# Patient Record
Sex: Female | Born: 1970 | Race: White | Hispanic: No | Smoking: Never smoker
Health system: Southern US, Community
[De-identification: ages and names within clinical notes are randomized; demographics above are authoritative.]

## PROBLEM LIST (undated history)

## (undated) DIAGNOSIS — J45909 Unspecified asthma, uncomplicated: Secondary | ICD-10-CM

## (undated) DIAGNOSIS — T7840XA Allergy, unspecified, initial encounter: Secondary | ICD-10-CM

## (undated) HISTORY — DX: Allergy, unspecified, initial encounter: T78.40XA

## (undated) HISTORY — DX: Unspecified asthma, uncomplicated: J45.909

---

## 1999-03-03 ENCOUNTER — Emergency Department (HOSPITAL_COMMUNITY): Admission: EM | Admit: 1999-03-03 | Discharge: 1999-03-03 | Payer: Self-pay | Admitting: Emergency Medicine

## 1999-08-29 ENCOUNTER — Emergency Department (HOSPITAL_COMMUNITY): Admission: EM | Admit: 1999-08-29 | Discharge: 1999-08-29 | Payer: Self-pay | Admitting: Emergency Medicine

## 2002-11-07 ENCOUNTER — Emergency Department (HOSPITAL_COMMUNITY): Admission: EM | Admit: 2002-11-07 | Discharge: 2002-11-07 | Payer: Self-pay | Admitting: Emergency Medicine

## 2002-11-11 ENCOUNTER — Emergency Department (HOSPITAL_COMMUNITY): Admission: EM | Admit: 2002-11-11 | Discharge: 2002-11-11 | Payer: Self-pay | Admitting: Emergency Medicine

## 2003-01-05 ENCOUNTER — Emergency Department (HOSPITAL_COMMUNITY): Admission: EM | Admit: 2003-01-05 | Discharge: 2003-01-05 | Payer: Self-pay | Admitting: Emergency Medicine

## 2003-01-13 ENCOUNTER — Emergency Department (HOSPITAL_COMMUNITY): Admission: EM | Admit: 2003-01-13 | Discharge: 2003-01-13 | Payer: Self-pay | Admitting: *Deleted

## 2003-02-01 ENCOUNTER — Emergency Department (HOSPITAL_COMMUNITY): Admission: EM | Admit: 2003-02-01 | Discharge: 2003-02-01 | Payer: Self-pay | Admitting: Emergency Medicine

## 2014-03-14 ENCOUNTER — Ambulatory Visit: Payer: Self-pay | Admitting: Physician Assistant

## 2014-03-17 ENCOUNTER — Telehealth: Payer: Self-pay | Admitting: Medical

## 2014-03-17 ENCOUNTER — Ambulatory Visit (INDEPENDENT_AMBULATORY_CARE_PROVIDER_SITE_OTHER): Payer: BC Managed Care – PPO | Admitting: Medical

## 2014-03-17 ENCOUNTER — Encounter: Payer: Self-pay | Admitting: Medical

## 2014-03-17 VITALS — BP 121/82 | HR 78 | Temp 98.4°F | Ht 64.75 in | Wt 258.6 lb

## 2014-03-17 DIAGNOSIS — Z0000 Encounter for general adult medical examination without abnormal findings: Secondary | ICD-10-CM

## 2014-03-17 DIAGNOSIS — Z0189 Encounter for other specified special examinations: Secondary | ICD-10-CM

## 2014-03-17 DIAGNOSIS — Z1231 Encounter for screening mammogram for malignant neoplasm of breast: Secondary | ICD-10-CM

## 2014-03-17 MED ORDER — ALBUTEROL SULFATE HFA 108 (90 BASE) MCG/ACT IN AERS: 2.0000 | INHALATION_SPRAY | Freq: Four times a day (QID) | RESPIRATORY_TRACT | Status: DC | PRN

## 2014-03-17 NOTE — Progress Notes (Signed)
Subjective:    Patient ID: Natalie Daniel, female    DOB: 11/23/1970, 43 y.o.   MRN: [id number]  HPI  PMH, PSH, FH, Social History, and surgery history.  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 2 years of college, drinks 1 coke a day, Pt does not exercise. Divorced- 3 children.  Pt states needs Physical today or she will charged $600 or more.  Pt hx of asthma and on an off wheezing. Her 02 sat 99%. Pt had asthma past 10 years.  History of arthritis- Daily knee pain recently.Hx of pain in the past. Just recently got worse.  Pt new and she wants CPE. I offered to see her for asthma and  arthralgia if she wants and do CPE later but she declined. On discussion with her she does not to have any labored breathing pattern.  Pt last physical done 2 years ago. Pap smear [id number]. Was normal. Pt has not had mammogram. Mom had breast cancer in early 50's.  [facility]   Past Medical History  Diagnosis Date  . Asthma   . Allergy     History   Social History  . Marital Status: Divorced    Spouse Name: N/A    Number of Children: N/A  . Years of Education: N/A   Occupational History  . Not on file.   Social History Main Topics  . Smoking status: Never Smoker   . Smokeless tobacco: Never Used  . Alcohol Use: No  . Drug Use: Not on file  . Sexual Activity: Not on file   Other Topics Concern  . Not on file   Social History Narrative  . No narrative on file    No past surgical history on file.  Family History  Problem Relation Age of Onset  . Cancer Mother     No Known Allergies  No current outpatient prescriptions on file prior to visit.   No current facility-administered medications on file prior to visit.    BP 121/82  Pulse 78  Temp(Src) 98.4 F (36.9 C) (Oral)  Ht 5' 4.75" (1.645 m)  Wt 258 lb 9.6 oz (117.3 kg)  BMI 43.35 kg/m2  SpO2 99%   Review of Systems  Constitutional: Negative for fever, chills and fatigue.  HENT:  Negative.   Respiratory: Positive for wheezing. Negative for cough, chest tightness and shortness of breath.        Mild occasional wheeze recently.  Gastrointestinal: Negative.   Genitourinary: Negative.   Musculoskeletal:       Some joint pain in the past. Mostly in her knees.  Neurological: Negative.   Hematological: Negative for adenopathy. Does not bruise/bleed easily.  Psychiatric/Behavioral: Negative.        Objective:   Physical Exam  Constitutional: She is oriented to person, place, and time. She appears well-developed and well-nourished. No distress.  HENT:  Head: Normocephalic and atraumatic.  Right Ear: External ear normal.  Left Ear: External ear normal.  Nose: Nose normal.  Mouth/Throat: Oropharynx is clear and moist. No oropharyngeal exudate.  Eyes: Conjunctivae and EOM are normal. Left eye exhibits no discharge. No scleral icterus.  Neck: Normal range of motion. Neck supple. No JVD present. No tracheal deviation present. No thyromegaly present.  Cardiovascular: Normal rate and regular rhythm.   Pulmonary/Chest: Effort normal and breath sounds normal. No stridor. No respiratory distress. She has no wheezes. She has no rales. She exhibits no tenderness.  Clear even and unlabored. No wheezing heard.  Abdominal: Soft. Bowel sounds are normal. She exhibits no distension and no mass. There is no tenderness. There is no rebound and no guarding.  Lymphadenopathy:    She has no cervical adenopathy.  Neurological: She is alert and oriented to person, place, and time. No cranial nerve deficit. Coordination normal.  Skin: Skin is warm. No rash noted. She is not diaphoretic. No erythema. No pallor.  Psychiatric: She has a normal mood and affect. Her behavior is normal. Judgment and thought content normal.             Assessment & Plan:

## 2014-03-17 NOTE — Telephone Encounter (Signed)
Pt wanted a cpe/wellness today. First time here. She reports some wheezing intermittent. I saw her  For cpe. Advised her to follow up 1-2 weeks. Discuss in depth arthritis and discuss asthma on follow up. I will go ahead and send albuterol rx to her pharmacy during the interim.

## 2014-03-17 NOTE — Progress Notes (Signed)
Pre visit review using our clinic review tool, if applicable. No additional management support is needed unless otherwise documented below in the visit note. 

## 2014-03-17 NOTE — Assessment & Plan Note (Signed)
Fasting cbc, cmp, tsh and lipid panel. Will order screening mammogram as well.

## 2014-03-17 NOTE — Patient Instructions (Addendum)
Please get labs today for your CPE. Follow up in 1-2 wks to discuss asthma and arthralgias   Preventive Care for Adults A healthy lifestyle and preventive care can promote health and wellness. Preventive health guidelines for women include the following key practices.  A routine yearly physical is a good way to check with your health care provider about your health and preventive screening. It is a chance to share any concerns and updates on your health and to receive a thorough exam.  Visit your dentist for a routine exam and preventive care every 6 months. Brush your teeth twice a day and floss once a day. Good oral hygiene prevents tooth decay and gum disease.  The frequency of eye exams is based on your age, health, family medical history, use of contact lenses, and other factors. Follow your health care provider's recommendations for frequency of eye exams.  Eat a healthy diet. Foods like vegetables, fruits, whole grains, low-fat dairy products, and lean protein foods contain the nutrients you need without too many calories. Decrease your intake of foods high in solid fats, added sugars, and salt. Eat the right amount of calories for you.Get information about a proper diet from your health care provider, if necessary.  Regular physical exercise is one of the most important things you can do for your health. Most adults should get at least 150 minutes of moderate-intensity exercise (any activity that increases your heart rate and causes you to sweat) each week. In addition, most adults need muscle-strengthening exercises on 2 or more days a week.  Maintain a healthy weight. The body mass index (BMI) is a screening tool to identify possible weight problems. It provides an estimate of body fat based on height and weight. Your health care provider can find your BMI and can help you achieve or maintain a healthy weight.For adults 20 years and older:  A BMI below 18.5 is considered  underweight.  A BMI of 18.5 to 24.9 is normal.  A BMI of 25 to 29.9 is considered overweight.  A BMI of 30 and above is considered obese.  Maintain normal blood lipids and cholesterol levels by exercising and minimizing your intake of saturated fat. Eat a balanced diet with plenty of fruit and vegetables. Blood tests for lipids and cholesterol should begin at age 67 and be repeated every 5 years. If your lipid or cholesterol levels are high, you are over 50, or you are at high risk for heart disease, you may need your cholesterol levels checked more frequently.Ongoing high lipid and cholesterol levels should be treated with medicines if diet and exercise are not working.  If you smoke, find out from your health care provider how to quit. If you do not use tobacco, do not start.  Lung cancer screening is recommended for adults aged 67-80 years who are at high risk for developing lung cancer because of a history of smoking. A yearly low-dose CT scan of the lungs is recommended for people who have at least a 30-pack-year history of smoking and are a current smoker or have quit within the past 15 years. A pack year of smoking is smoking an average of 1 pack of cigarettes a day for 1 year (for example: 1 pack a day for 30 years or 2 packs a day for 15 years). Yearly screening should continue until the smoker has stopped smoking for at least 15 years. Yearly screening should be stopped for people who develop a health problem that would prevent  them from having lung cancer treatment.  If you are pregnant, do not drink alcohol. If you are breastfeeding, be very cautious about drinking alcohol. If you are not pregnant and choose to drink alcohol, do not have more than 1 drink per day. One drink is considered to be 12 ounces (355 mL) of beer, 5 ounces (148 mL) of wine, or 1.5 ounces (44 mL) of liquor.  Avoid use of street drugs. Do not share needles with anyone. Ask for help if you need support or  instructions about stopping the use of drugs.  High blood pressure causes heart disease and increases the risk of stroke. Your blood pressure should be checked at least every 1 to 2 years. Ongoing high blood pressure should be treated with medicines if weight loss and exercise do not work.  If you are 54-34 years old, ask your health care provider if you should take aspirin to prevent strokes.  Diabetes screening involves taking a blood sample to check your fasting blood sugar level. This should be done once every 3 years, after age 14, if you are within normal weight and without risk factors for diabetes. Testing should be considered at a younger age or be carried out more frequently if you are overweight and have at least 1 risk factor for diabetes.  Breast cancer screening is essential preventive care for women. You should practice "breast self-awareness." This means understanding the normal appearance and feel of your breasts and may include breast self-examination. Any changes detected, no matter how small, should be reported to a health care provider. Women in their 27s and 30s should have a clinical breast exam (CBE) by a health care provider as part of a regular health exam every 1 to 3 years. After age 76, women should have a CBE every year. Starting at age 23, women should consider having a mammogram (breast X-ray test) every year. Women who have a family history of breast cancer should talk to their health care provider about genetic screening. Women at a high risk of breast cancer should talk to their health care providers about having an MRI and a mammogram every year.  Breast cancer gene (BRCA)-related cancer risk assessment is recommended for women who have family members with BRCA-related cancers. BRCA-related cancers include breast, ovarian, tubal, and peritoneal cancers. Having family members with these cancers may be associated with an increased risk for harmful changes (mutations) in  the breast cancer genes BRCA1 and BRCA2. Results of the assessment will determine the need for genetic counseling and BRCA1 and BRCA2 testing.  Routine pelvic exams to screen for cancer are no longer recommended for nonpregnant women who are considered low risk for cancer of the pelvic organs (ovaries, uterus, and vagina) and who do not have symptoms. Ask your health care provider if a screening pelvic exam is right for you.  If you have had past treatment for cervical cancer or a condition that could lead to cancer, you need Pap tests and screening for cancer for at least 20 years after your treatment. If Pap tests have been discontinued, your risk factors (such as having a new sexual partner) need to be reassessed to determine if screening should be resumed. Some women have medical problems that increase the chance of getting cervical cancer. In these cases, your health care provider may recommend more frequent screening and Pap tests.  The HPV test is an additional test that may be used for cervical cancer screening. The HPV test looks for the  virus that can cause the cell changes on the cervix. The cells collected during the Pap test can be tested for HPV. The HPV test could be used to screen women aged 76 years and older, and should be used in women of any age who have unclear Pap test results. After the age of 36, women should have HPV testing at the same frequency as a Pap test.  Colorectal cancer can be detected and often prevented. Most routine colorectal cancer screening begins at the age of 56 years and continues through age 15 years. However, your health care provider may recommend screening at an earlier age if you have risk factors for colon cancer. On a yearly basis, your health care provider may provide home test kits to check for hidden blood in the stool. Use of a small camera at the end of a tube, to directly examine the colon (sigmoidoscopy or colonoscopy), can detect the earliest forms  of colorectal cancer. Talk to your health care provider about this at age 76, when routine screening begins. Direct exam of the colon should be repeated every 5-10 years through age 50 years, unless early forms of pre-cancerous polyps or small growths are found.  People who are at an increased risk for hepatitis B should be screened for this virus. You are considered at high risk for hepatitis B if:  You were born in a country where hepatitis B occurs often. Talk with your health care provider about which countries are considered high risk.  Your parents were born in a high-risk country and you have not received a shot to protect against hepatitis B (hepatitis B vaccine).  You have HIV or AIDS.  You use needles to inject street drugs.  You live with, or have sex with, someone who has hepatitis B.  You get hemodialysis treatment.  You take certain medicines for conditions like cancer, organ transplantation, and autoimmune conditions.  Hepatitis C blood testing is recommended for all people born from [birth year] through 1965 and any individual with known risks for hepatitis C.  Practice safe sex. Use condoms and avoid high-risk sexual practices to reduce the spread of sexually transmitted infections (STIs). STIs include gonorrhea, chlamydia, syphilis, trichomonas, herpes, HPV, and human immunodeficiency virus (HIV). Herpes, HIV, and HPV are viral illnesses that have no cure. They can result in disability, cancer, and death.  You should be screened for sexually transmitted illnesses (STIs) including gonorrhea and chlamydia if:  You are sexually active and are younger than 24 years.  You are older than 24 years and your health care provider tells you that you are at risk for this type of infection.  Your sexual activity has changed since you were last screened and you are at an increased risk for chlamydia or gonorrhea. Ask your health care provider if you are at risk.  If you are at risk of  being infected with HIV, it is recommended that you take a prescription medicine daily to prevent HIV infection. This is called preexposure prophylaxis (PrEP). You are considered at risk if:  You are a heterosexual woman, are sexually active, and are at increased risk for HIV infection.  You take drugs by injection.  You are sexually active with a partner who has HIV.  Talk with your health care provider about whether you are at high risk of being infected with HIV. If you choose to begin PrEP, you should first be tested for HIV. You should then be tested every 3 months for as  long as you are taking PrEP.  Osteoporosis is a disease in which the bones lose minerals and strength with aging. This can result in serious bone fractures or breaks. The risk of osteoporosis can be identified using a bone density scan. Women ages 63 years and over and women at risk for fractures or osteoporosis should discuss screening with their health care providers. Ask your health care provider whether you should take a calcium supplement or vitamin D to reduce the rate of osteoporosis.  Menopause can be associated with physical symptoms and risks. Hormone replacement therapy is available to decrease symptoms and risks. You should talk to your health care provider about whether hormone replacement therapy is right for you.  Use sunscreen. Apply sunscreen liberally and repeatedly throughout the day. You should seek shade when your shadow is shorter than you. Protect yourself by wearing long sleeves, pants, a wide-brimmed hat, and sunglasses year round, whenever you are outdoors.  Once a month, do a whole body skin exam, using a mirror to look at the skin on your back. Tell your health care provider of new moles, moles that have irregular borders, moles that are larger than a pencil eraser, or moles that have changed in shape or color.  Stay current with required vaccines (immunizations).  Influenza vaccine. All adults  should be immunized every year.  Tetanus, diphtheria, and acellular pertussis (Td, Tdap) vaccine. Pregnant women should receive 1 dose of Tdap vaccine during each pregnancy. The dose should be obtained regardless of the length of time since the last dose. Immunization is preferred during the 27th-36th week of gestation. An adult who has not previously received Tdap or who does not know her vaccine status should receive 1 dose of Tdap. This initial dose should be followed by tetanus and diphtheria toxoids (Td) booster doses every 10 years. Adults with an unknown or incomplete history of completing a 3-dose immunization series with Td-containing vaccines should begin or complete a primary immunization series including a Tdap dose. Adults should receive a Td booster every 10 years.  Varicella vaccine. An adult without evidence of immunity to varicella should receive 2 doses or a second dose if she has previously received 1 dose. Pregnant females who do not have evidence of immunity should receive the first dose after pregnancy. This first dose should be obtained before leaving the health care facility. The second dose should be obtained 4-8 weeks after the first dose.  Human papillomavirus (HPV) vaccine. Females aged 13-26 years who have not received the vaccine previously should obtain the 3-dose series. The vaccine is not recommended for use in pregnant females. However, pregnancy testing is not needed before receiving a dose. If a female is found to be pregnant after receiving a dose, no treatment is needed. In that case, the remaining doses should be delayed until after the pregnancy. Immunization is recommended for any person with an immunocompromised condition through the age of 30 years if she did not get any or all doses earlier. During the 3-dose series, the second dose should be obtained 4-8 weeks after the first dose. The third dose should be obtained 24 weeks after the first dose and 16 weeks after  the second dose.  Zoster vaccine. One dose is recommended for adults aged 51 years or older unless certain conditions are present.  Measles, mumps, and rubella (MMR) vaccine. Adults born before [birth year] generally are considered immune to measles and mumps. Adults born in [birth year] or later should have 1 or more doses  of MMR vaccine unless there is a contraindication to the vaccine or there is laboratory evidence of immunity to each of the three diseases. A routine second dose of MMR vaccine should be obtained at least 28 days after the first dose for students attending postsecondary schools, health care workers, or international travelers. People who received inactivated measles vaccine or an unknown type of measles vaccine during 1963-1967 should receive 2 doses of MMR vaccine. People who received inactivated mumps vaccine or an unknown type of mumps vaccine before 1979 and are at high risk for mumps infection should consider immunization with 2 doses of MMR vaccine. For females of childbearing age, rubella immunity should be determined. If there is no evidence of immunity, females who are not pregnant should be vaccinated. If there is no evidence of immunity, females who are pregnant should delay immunization until after pregnancy. Unvaccinated health care workers born before [birth year] who lack laboratory evidence of measles, mumps, or rubella immunity or laboratory confirmation of disease should consider measles and mumps immunization with 2 doses of MMR vaccine or rubella immunization with 1 dose of MMR vaccine.  Pneumococcal 13-valent conjugate (PCV13) vaccine. When indicated, a person who is uncertain of her immunization history and has no record of immunization should receive the PCV13 vaccine. An adult aged 28 years or older who has certain medical conditions and has not been previously immunized should receive 1 dose of PCV13 vaccine. This PCV13 should be followed with a dose of pneumococcal polysaccharide (PPSV23)  vaccine. The PPSV23 vaccine dose should be obtained at least 8 weeks after the dose of PCV13 vaccine. An adult aged 74 years or older who has certain medical conditions and previously received 1 or more doses of PPSV23 vaccine should receive 1 dose of PCV13. The PCV13 vaccine dose should be obtained 1 or more years after the last PPSV23 vaccine dose.  Pneumococcal polysaccharide (PPSV23) vaccine. When PCV13 is also indicated, PCV13 should be obtained first. All adults aged 17 years and older should be immunized. An adult younger than age 38 years who has certain medical conditions should be immunized. Any person who resides in a nursing home or long-term care facility should be immunized. An adult smoker should be immunized. People with an immunocompromised condition and certain other conditions should receive both PCV13 and PPSV23 vaccines. People with human immunodeficiency virus (HIV) infection should be immunized as soon as possible after diagnosis. Immunization during chemotherapy or radiation therapy should be avoided. Routine use of PPSV23 vaccine is not recommended for American Indians, [place] Natives, or people younger than 65 years unless there are medical conditions that require PPSV23 vaccine. When indicated, people who have unknown immunization and have no record of immunization should receive PPSV23 vaccine. One-time revaccination 5 years after the first dose of PPSV23 is recommended for people aged 19-64 years who have chronic kidney failure, nephrotic syndrome, asplenia, or immunocompromised conditions. People who received 1-2 doses of PPSV23 before age 72 years should receive another dose of PPSV23 vaccine at age 28 years or later if at least 5 years have passed since the previous dose. Doses of PPSV23 are not needed for people immunized with PPSV23 at or after age 72 years.  Meningococcal vaccine. Adults with asplenia or persistent complement component deficiencies should receive 2 doses of  quadrivalent meningococcal conjugate (MenACWY-D) vaccine. The doses should be obtained at least 2 months apart. Microbiologists working with certain meningococcal bacteria, [place] recruits, people at risk during an outbreak, and people who travel to or  live in countries with a high rate of meningitis should be immunized. A first-year college student up through age 30 years who is living in a residence hall should receive a dose if she did not receive a dose on or after her 16th birthday. Adults who have certain high-risk conditions should receive one or more doses of vaccine.  Hepatitis A vaccine. Adults who wish to be protected from this disease, have certain high-risk conditions, work with hepatitis A-infected animals, work in hepatitis A research labs, or travel to or work in countries with a high rate of hepatitis A should be immunized. Adults who were previously unvaccinated and who anticipate close contact with an international adoptee during the first 60 days after arrival in the [place] from a country with a high rate of hepatitis A should be immunized.  Hepatitis B vaccine. Adults who wish to be protected from this disease, have certain high-risk conditions, may be exposed to blood or other infectious body fluids, are household contacts or sex partners of hepatitis B positive people, are clients or workers in certain care facilities, or travel to or work in countries with a high rate of hepatitis B should be immunized.  Haemophilus influenzae type b (Hib) vaccine. A previously unvaccinated person with asplenia or sickle cell disease or having a scheduled splenectomy should receive 1 dose of Hib vaccine. Regardless of previous immunization, a recipient of a hematopoietic stem cell transplant should receive a 3-dose series 6-12 months after her successful transplant. Hib vaccine is not recommended for adults with HIV infection. Preventive Services / Frequency Ages 73 to 60 years  Blood  pressure check.** / Every 1 to 2 years.  Lipid and cholesterol check.** / Every 5 years beginning at age 45.  Clinical breast exam.** / Every 3 years for women in their 55s and 37s.  BRCA-related cancer risk assessment.** / For women who have family members with a BRCA-related cancer (breast, ovarian, tubal, or peritoneal cancers).  Pap test.** / Every 2 years from ages 42 through 63. Every 3 years starting at age 46 through age 71 or 61 with a history of 3 consecutive normal Pap tests.  HPV screening.** / Every 3 years from ages 65 through ages 10 to 83 with a history of 3 consecutive normal Pap tests.  Hepatitis C blood test.** / For any individual with known risks for hepatitis C.  Skin self-exam. / Monthly.  Influenza vaccine. / Every year.  Tetanus, diphtheria, and acellular pertussis (Tdap, Td) vaccine.** / Consult your health care provider. Pregnant women should receive 1 dose of Tdap vaccine during each pregnancy. 1 dose of Td every 10 years.  Varicella vaccine.** / Consult your health care provider. Pregnant females who do not have evidence of immunity should receive the first dose after pregnancy.  HPV vaccine. / 3 doses over 6 months, if 55 and younger. The vaccine is not recommended for use in pregnant females. However, pregnancy testing is not needed before receiving a dose.  Measles, mumps, rubella (MMR) vaccine.** / You need at least 1 dose of MMR if you were born in 1957 or later. You may also need a 2nd dose. For females of childbearing age, rubella immunity should be determined. If there is no evidence of immunity, females who are not pregnant should be vaccinated. If there is no evidence of immunity, females who are pregnant should delay immunization until after pregnancy.  Pneumococcal 13-valent conjugate (PCV13) vaccine.** / Consult your health care provider.  Pneumococcal polysaccharide (PPSV23)  vaccine.** / 1 to 2 doses if you smoke cigarettes or if you have certain  conditions.  Meningococcal vaccine.** / 1 dose if you are age 62 to 64 years and a Market researcher living in a residence hall, or have one of several medical conditions, you need to get vaccinated against meningococcal disease. You may also need additional booster doses.  Hepatitis A vaccine.** / Consult your health care provider.  Hepatitis B vaccine.** / Consult your health care provider.  Haemophilus influenzae type b (Hib) vaccine.** / Consult your health care provider. Ages 77 to 19 years  Blood pressure check.** / Every 1 to 2 years.  Lipid and cholesterol check.** / Every 5 years beginning at age 80 years.  Lung cancer screening. / Every year if you are aged 46-80 years and have a 30-pack-year history of smoking and currently smoke or have quit within the past 15 years. Yearly screening is stopped once you have quit smoking for at least 15 years or develop a health problem that would prevent you from having lung cancer treatment.  Clinical breast exam.** / Every year after age 19 years.  BRCA-related cancer risk assessment.** / For women who have family members with a BRCA-related cancer (breast, ovarian, tubal, or peritoneal cancers).  Mammogram.** / Every year beginning at age 43 years and continuing for as long as you are in good health. Consult with your health care provider.  Pap test.** / Every 3 years starting at age 21 years through age 42 or 97 years with a history of 3 consecutive normal Pap tests.  HPV screening.** / Every 3 years from ages 5 years through ages 74 to 53 years with a history of 3 consecutive normal Pap tests.  Fecal occult blood test (FOBT) of stool. / Every year beginning at age 37 years and continuing until age 47 years. You may not need to do this test if you get a colonoscopy every 10 years.  Flexible sigmoidoscopy or colonoscopy.** / Every 5 years for a flexible sigmoidoscopy or every 10 years for a colonoscopy beginning at age 34 years  and continuing until age 43 years.  Hepatitis C blood test.** / For all people born from [birth year] through 1964 and any individual with known risks for hepatitis C.  Skin self-exam. / Monthly.  Influenza vaccine. / Every year.  Tetanus, diphtheria, and acellular pertussis (Tdap/Td) vaccine.** / Consult your health care provider. Pregnant women should receive 1 dose of Tdap vaccine during each pregnancy. 1 dose of Td every 10 years.  Varicella vaccine.** / Consult your health care provider. Pregnant females who do not have evidence of immunity should receive the first dose after pregnancy.  Zoster vaccine.** / 1 dose for adults aged 31 years or older.  Measles, mumps, rubella (MMR) vaccine.** / You need at least 1 dose of MMR if you were born in 1957 or later. You may also need a 2nd dose. For females of childbearing age, rubella immunity should be determined. If there is no evidence of immunity, females who are not pregnant should be vaccinated. If there is no evidence of immunity, females who are pregnant should delay immunization until after pregnancy.  Pneumococcal 13-valent conjugate (PCV13) vaccine.** / Consult your health care provider.  Pneumococcal polysaccharide (PPSV23) vaccine.** / 1 to 2 doses if you smoke cigarettes or if you have certain conditions.  Meningococcal vaccine.** / Consult your health care provider.  Hepatitis A vaccine.** / Consult your health care provider.  Hepatitis B vaccine.** /  Consult your health care provider.  Haemophilus influenzae type b (Hib) vaccine.** / Consult your health care provider. Ages 91 years and over  Blood pressure check.** / Every 1 to 2 years.  Lipid and cholesterol check.** / Every 5 years beginning at age 15 years.  Lung cancer screening. / Every year if you are aged 82-80 years and have a 30-pack-year history of smoking and currently smoke or have quit within the past 15 years. Yearly screening is stopped once you have quit smoking  for at least 15 years or develop a health problem that would prevent you from having lung cancer treatment.  Clinical breast exam.** / Every year after age 36 years.  BRCA-related cancer risk assessment.** / For women who have family members with a BRCA-related cancer (breast, ovarian, tubal, or peritoneal cancers).  Mammogram.** / Every year beginning at age 69 years and continuing for as long as you are in good health. Consult with your health care provider.  Pap test.** / Every 3 years starting at age 58 years through age 66 or 38 years with 3 consecutive normal Pap tests. Testing can be stopped between 65 and 70 years with 3 consecutive normal Pap tests and no abnormal Pap or HPV tests in the past 10 years.  HPV screening.** / Every 3 years from ages 68 years through ages 64 or 32 years with a history of 3 consecutive normal Pap tests. Testing can be stopped between 65 and 70 years with 3 consecutive normal Pap tests and no abnormal Pap or HPV tests in the past 10 years.  Fecal occult blood test (FOBT) of stool. / Every year beginning at age 48 years and continuing until age 63 years. You may not need to do this test if you get a colonoscopy every 10 years.  Flexible sigmoidoscopy or colonoscopy.** / Every 5 years for a flexible sigmoidoscopy or every 10 years for a colonoscopy beginning at age 34 years and continuing until age 94 years.  Hepatitis C blood test.** / For all people born from [birth year] through 1964 and any individual with known risks for hepatitis C.  Osteoporosis screening.** / A one-time screening for women ages 10 years and over and women at risk for fractures or osteoporosis.  Skin self-exam. / Monthly.  Influenza vaccine. / Every year.  Tetanus, diphtheria, and acellular pertussis (Tdap/Td) vaccine.** / 1 dose of Td every 10 years.  Varicella vaccine.** / Consult your health care provider.  Zoster vaccine.** / 1 dose for adults aged 31 years or older.  Pneumococcal  13-valent conjugate (PCV13) vaccine.** / Consult your health care provider.  Pneumococcal polysaccharide (PPSV23) vaccine.** / 1 dose for all adults aged 34 years and older.  Meningococcal vaccine.** / Consult your health care provider.  Hepatitis A vaccine.** / Consult your health care provider.  Hepatitis B vaccine.** / Consult your health care provider.  Haemophilus influenzae type b (Hib) vaccine.** / Consult your health care provider. ** Family history and personal history of risk and conditions may change your health care provider's recommendations. Document Released: 07/01/2001 Document Revised: 09/19/2013 Document Reviewed: 09/30/2010 [facility] Patient Information 2015 [place], [place]. This information is not intended to replace advice given to you by your health care provider. Make sure you discuss any questions you have with your health care provider.

## 2014-03-18 LAB — COMPREHENSIVE METABOLIC PANEL
ALK PHOS: 63 U/L (ref 39–117)
ALT: 21 U/L (ref 0–35)
AST: 21 U/L (ref 0–37)
Albumin: 3.9 g/dL (ref 3.5–5.2)
BILIRUBIN TOTAL: 0.5 mg/dL (ref 0.2–1.2)
BUN: 13 mg/dL (ref 6–23)
CO2: 26 mEq/L (ref 19–32)
CREATININE: 0.81 mg/dL (ref 0.50–1.10)
Calcium: 8.8 mg/dL (ref 8.4–10.5)
Chloride: 103 mEq/L (ref 96–112)
GLUCOSE: 85 mg/dL (ref 70–99)
Potassium: 3.8 mEq/L (ref 3.5–5.3)
SODIUM: 140 meq/L (ref 135–145)
TOTAL PROTEIN: 7 g/dL (ref 6.0–8.3)

## 2014-03-18 LAB — CBC WITH DIFFERENTIAL/PLATELET
BASOS PCT: 1 % (ref 0–1)
Basophils Absolute: 0.1 10*3/uL (ref 0.0–0.1)
EOS ABS: 0.3 10*3/uL (ref 0.0–0.7)
Eosinophils Relative: 4 % (ref 0–5)
HCT: 38.1 % (ref 36.0–46.0)
Hemoglobin: 12.7 g/dL (ref 12.0–15.0)
Lymphocytes Relative: 34 % (ref 12–46)
Lymphs Abs: 2.8 10*3/uL (ref 0.7–4.0)
MCH: 28.5 pg (ref 26.0–34.0)
MCHC: 33.3 g/dL (ref 30.0–36.0)
MCV: 85.4 fL (ref 78.0–100.0)
Monocytes Absolute: 0.5 10*3/uL (ref 0.1–1.0)
Monocytes Relative: 6 % (ref 3–12)
NEUTROS PCT: 55 % (ref 43–77)
Neutro Abs: 4.6 10*3/uL (ref 1.7–7.7)
PLATELETS: 366 10*3/uL (ref 150–400)
RBC: 4.46 MIL/uL (ref 3.87–5.11)
RDW: 13.5 % (ref 11.5–15.5)
WBC: 8.3 10*3/uL (ref 4.0–10.5)

## 2014-03-18 LAB — LIPID PANEL
CHOL/HDL RATIO: 3.3 ratio
CHOLESTEROL: 127 mg/dL (ref 0–200)
HDL: 38 mg/dL — AB (ref >39)
LDL Cholesterol: 58 mg/dL (ref 0–99)
Triglycerides: 157 mg/dL — ABNORMAL HIGH (ref <150)
VLDL: 31 mg/dL (ref 0–40)

## 2014-03-18 LAB — TSH: TSH: 1.815 u[IU]/mL (ref 0.350–4.500)

## 2014-03-20 NOTE — Telephone Encounter (Signed)
Called patient left message regarding Albuterol and and the need for patient to be seen . Will discuss asthma and allergy on next visit.

## 2014-12-20 ENCOUNTER — Ambulatory Visit (INDEPENDENT_AMBULATORY_CARE_PROVIDER_SITE_OTHER): Payer: BLUE CROSS/BLUE SHIELD | Admitting: Medical

## 2014-12-20 ENCOUNTER — Other Ambulatory Visit: Payer: Self-pay | Admitting: Medical

## 2014-12-20 ENCOUNTER — Telehealth: Payer: Self-pay | Admitting: Medical

## 2014-12-20 ENCOUNTER — Ambulatory Visit (HOSPITAL_BASED_OUTPATIENT_CLINIC_OR_DEPARTMENT_OTHER)
Admission: RE | Admit: 2014-12-20 | Discharge: 2014-12-20 | Disposition: A | Discharge status: Home / Self Care | Payer: BLUE CROSS/BLUE SHIELD | Source: Ambulatory Visit | Attending: Medical | Admitting: Medical

## 2014-12-20 ENCOUNTER — Encounter: Payer: Self-pay | Admitting: Medical

## 2014-12-20 VITALS — BP 120/85 | HR 69 | Temp 98.7°F | Ht 64.75 in | Wt 261.4 lb

## 2014-12-20 DIAGNOSIS — N910 Primary amenorrhea: Secondary | ICD-10-CM | POA: Diagnosis not present

## 2014-12-20 DIAGNOSIS — N926 Irregular menstruation, unspecified: Secondary | ICD-10-CM | POA: Insufficient documentation

## 2014-12-20 DIAGNOSIS — M25562 Pain in left knee: Secondary | ICD-10-CM | POA: Diagnosis not present

## 2014-12-20 DIAGNOSIS — M5441 Lumbago with sciatica, right side: Secondary | ICD-10-CM | POA: Diagnosis not present

## 2014-12-20 DIAGNOSIS — R938 Abnormal findings on diagnostic imaging of other specified body structures: Secondary | ICD-10-CM | POA: Diagnosis not present

## 2014-12-20 DIAGNOSIS — M25561 Pain in right knee: Secondary | ICD-10-CM | POA: Insufficient documentation

## 2014-12-20 DIAGNOSIS — M5442 Lumbago with sciatica, left side: Secondary | ICD-10-CM

## 2014-12-20 DIAGNOSIS — J4541 Moderate persistent asthma with (acute) exacerbation: Secondary | ICD-10-CM | POA: Diagnosis not present

## 2014-12-20 DIAGNOSIS — M4316 Spondylolisthesis, lumbar region: Secondary | ICD-10-CM | POA: Insufficient documentation

## 2014-12-20 DIAGNOSIS — N200 Calculus of kidney: Secondary | ICD-10-CM

## 2014-12-20 LAB — POCT URINE PREGNANCY: Preg Test, Ur: NEGATIVE

## 2014-12-20 MED ORDER — MONTELUKAST SODIUM 10 MG PO TABS: 10.0000 mg | ORAL_TABLET | Freq: Every day | ORAL | Status: AC

## 2014-12-20 MED ORDER — PREDNISONE 20 MG PO TABS: ORAL_TABLET | ORAL | Status: DC

## 2014-12-20 MED ORDER — ALBUTEROL SULFATE HFA 108 (90 BASE) MCG/ACT IN AERS: 2.0000 | INHALATION_SPRAY | Freq: Four times a day (QID) | RESPIRATORY_TRACT | Status: DC | PRN

## 2014-12-20 MED ORDER — BECLOMETHASONE DIPROPIONATE 40 MCG/ACT IN AERS
2.0000 | INHALATION_SPRAY | Freq: Two times a day (BID) | RESPIRATORY_TRACT | Status: AC
Start: 2014-12-20 — End: ?

## 2014-12-20 MED ORDER — FLUTICASONE PROPIONATE HFA 110 MCG/ACT IN AERO
2.0000 | INHALATION_SPRAY | Freq: Two times a day (BID) | RESPIRATORY_TRACT | Status: AC
Start: 2014-12-20 — End: ?

## 2014-12-20 NOTE — Telephone Encounter (Signed)
Lt pt know degenerative changes to lspine. But also calcified density in rt flank. Radiolgoist thinks maybe kidney stone. So he recommended ct scan. I placed that order in.

## 2014-12-20 NOTE — Telephone Encounter (Signed)
Pt called stating Qvar is $180. She can't afford that. She is asking if we have samples, discount cards, or an alternative medication. Please call her back.

## 2014-12-20 NOTE — Telephone Encounter (Signed)
Left patient message regarding new Rx sent in for  Her. Advised to call if problems.

## 2014-12-20 NOTE — Addendum Note (Signed)
Addended by: Lurline Hare on: 12/20/2014 03:09 PM   Modules accepted: Orders

## 2014-12-20 NOTE — Patient Instructions (Addendum)
For your knees get bilateral knee xrays.   For back get lumbar xray.  Will rx prednsione at pt request. Should help with her back, knees and her asthma.  Rx montelukast and qvar.  Follow up in 2 wks or as needed

## 2014-12-20 NOTE — Telephone Encounter (Signed)
Let pt know I tried to rx flovent inhaler. Maybe this is cheaper

## 2014-12-20 NOTE — Progress Notes (Signed)
Pre visit review using our clinic review tool, if applicable. No additional management support is needed unless otherwise documented below in the visit note. 

## 2014-12-20 NOTE — Progress Notes (Signed)
Subjective:    Patient ID: Natalie Daniel, female    DOB: 18-Oct-1970, 44 y.o.   MRN: [id number]  HPI  Pt has both sides knee pain. Both are stiff. Pain all the time. Worse in the morning. No hand or elbow pain. Pt taking ibuprofen. No hx of xrays of her knees. Hurting for a year daily but no injury.  Lower back pain at times for 2 yrs(daily pain worse with activity). Rt side para lumbar with occasional pain shooting down her rt leg. But no numbness between legs. No foot drop. Both legs occasioanlly feel tired.  LMP- none since her last depo shot 6 months ago. preg test neg today.  Also some daily wheezing. She is on albuterol. She used to be on singulair. Pt used to be on qvar. 2-3 times a day. Several years since wheezing daily. Pt has not used singulair or qvar for one year.   Review of Systems  Constitutional: Negative for fever, chills, diaphoresis, activity change and fatigue.  Respiratory: Positive for wheezing. Negative for cough, chest tightness and shortness of breath.   Cardiovascular: Negative for chest pain, palpitations and leg swelling.  Gastrointestinal: Negative for nausea, vomiting and abdominal pain.  Musculoskeletal: Positive for back pain. Negative for neck pain and neck stiffness.       Knee pain.  Neurological: Negative for dizziness and weakness.  Psychiatric/Behavioral: Negative for behavioral problems, confusion and agitation. The patient is not nervous/anxious.     Past Medical History  Diagnosis Date  . Asthma   . Allergy     History   Social History  . Marital Status: Divorced    Spouse Name: N/A  . Number of Children: N/A  . Years of Education: N/A   Occupational History  . Not on file.   Social History Main Topics  . Smoking status: Never Smoker   . Smokeless tobacco: Never Used  . Alcohol Use: No  . Drug Use: Not on file  . Sexual Activity: Not on file   Other Topics Concern  . Not on file   Social History Narrative    No past  surgical history on file.  Family History  Problem Relation Age of Onset  . Cancer Mother     Allergies  Allergen Reactions  . Vioxx [Rofecoxib] Nausea And Vomiting    No current outpatient prescriptions on file prior to visit.   No current facility-administered medications on file prior to visit.    BP 120/85 mmHg  Pulse 69  Temp(Src) 98.7 F (37.1 C) (Oral)  Ht 5' 4.75" (1.645 m)  Wt 261 lb 6.4 oz (118.57 kg)  BMI 43.82 kg/m2  SpO2 100%       Objective:   Physical Exam  General Appearance- Not in acute distress.    Chest and Lung Exam Auscultation: Breath sounds:-Normal. Clear even and unlabored. Adventitious sounds:- No Adventitious sounds.  Cardiovascular Auscultation:Rythm - Regular, rate and rythm. Heart Sounds -Normal heart sounds.  Abdomen Inspection:-Inspection Normal.  Palpation/Perucssion: Palpation and Percussion of the abdomen reveal- Non Tender, No Rebound tenderness, No rigidity(Guarding) and No Palpable abdominal masses.  Liver:-Normal.  Spleen:- Normal.   Back Mid lumbar spine tenderness to palpation. Pain on straight leg lift. Pain on lateral movements and flexion/extension of the spine.  Lower ext neurologic  L5-S1 sensation intact bilaterally. Normal patellar reflexes bilaterally. No foot drop bilaterally.  Both knees- moderate crepitus to palpation and rom.      Assessment & Plan:  For your knees get  bilateral knee xrays.   For back get lumbar xray.  Will rx prednsione at pt request. Should help with her back, knees and her asthma.  Rx montelukast and qvar.  Follow up in 2 wks or as needed

## 2014-12-21 ENCOUNTER — Encounter: Payer: Self-pay | Admitting: Medical

## 2014-12-21 ENCOUNTER — Ambulatory Visit (HOSPITAL_BASED_OUTPATIENT_CLINIC_OR_DEPARTMENT_OTHER): Admission: RE | Admit: 2014-12-21 | Payer: BLUE CROSS/BLUE SHIELD | Source: Ambulatory Visit

## 2014-12-21 NOTE — Telephone Encounter (Signed)
Patient called,given XR results. Patient would like to know what could be done about pain in knees. Forwarded concern to ES for review. Note given to KT nurse regarding follow up appointment for patient in near future.

## 2014-12-21 NOTE — Telephone Encounter (Signed)
Patient advised  referral placed for Orthopedics and she will receive call from Referral Department.

## 2014-12-21 NOTE — Telephone Encounter (Signed)
Pt has tricompartmental degenerative changes. Did refer her to ortho. I thought I sent that note. I could not find any note regarding her knees that I sent you?Marland Kitchen Would you please call her and advsie

## 2014-12-21 NOTE — Progress Notes (Unsigned)
Patient states she is having severe pain in both knees. Advised XR showed no abnormalities. Advised patient I would inform you to see if you wanted to prescribe or refer her to specialty. Please advise.

## 2015-01-02 ENCOUNTER — Ambulatory Visit (HOSPITAL_BASED_OUTPATIENT_CLINIC_OR_DEPARTMENT_OTHER)
Admission: RE | Admit: 2015-01-02 | Discharge: 2015-01-02 | Disposition: A | Discharge status: Home / Self Care | Payer: BLUE CROSS/BLUE SHIELD | Source: Ambulatory Visit | Attending: Medical | Admitting: Medical

## 2015-01-02 DIAGNOSIS — M5441 Lumbago with sciatica, right side: Secondary | ICD-10-CM

## 2015-01-02 DIAGNOSIS — M47896 Other spondylosis, lumbar region: Secondary | ICD-10-CM | POA: Diagnosis not present

## 2015-01-02 DIAGNOSIS — M4806 Spinal stenosis, lumbar region: Secondary | ICD-10-CM | POA: Diagnosis not present

## 2015-01-02 DIAGNOSIS — M5442 Lumbago with sciatica, left side: Secondary | ICD-10-CM

## 2015-01-02 DIAGNOSIS — N200 Calculus of kidney: Secondary | ICD-10-CM

## 2015-01-02 DIAGNOSIS — M549 Dorsalgia, unspecified: Secondary | ICD-10-CM | POA: Diagnosis present

## 2015-01-05 ENCOUNTER — Telehealth: Payer: Self-pay | Admitting: Medical

## 2015-01-05 MED ORDER — PREDNISONE 20 MG PO TABS: ORAL_TABLET | ORAL | Status: AC

## 2015-01-05 NOTE — Telephone Encounter (Signed)
Will rx prednisone. Considered melxicam. But pt had reaction to same class med.

## 2015-01-05 NOTE — Telephone Encounter (Signed)
Spoke with pt and she voices understanding.  

## 2015-01-05 NOTE — Telephone Encounter (Signed)
Pt returning your call. Please call back °

## 2015-01-09 ENCOUNTER — Ambulatory Visit (HOSPITAL_BASED_OUTPATIENT_CLINIC_OR_DEPARTMENT_OTHER): Payer: BLUE CROSS/BLUE SHIELD

## 2015-05-03 ENCOUNTER — Ambulatory Visit: Payer: BLUE CROSS/BLUE SHIELD | Attending: Sports Medicine | Admitting: Physical Therapy

## 2015-05-03 DIAGNOSIS — R262 Difficulty in walking, not elsewhere classified: Secondary | ICD-10-CM | POA: Diagnosis present

## 2015-05-03 DIAGNOSIS — M25561 Pain in right knee: Secondary | ICD-10-CM | POA: Insufficient documentation

## 2015-05-03 DIAGNOSIS — R29898 Other symptoms and signs involving the musculoskeletal system: Secondary | ICD-10-CM | POA: Insufficient documentation

## 2015-05-03 DIAGNOSIS — M25562 Pain in left knee: Secondary | ICD-10-CM | POA: Insufficient documentation

## 2015-05-03 DIAGNOSIS — R269 Unspecified abnormalities of gait and mobility: Secondary | ICD-10-CM | POA: Diagnosis present

## 2015-05-03 NOTE — Therapy (Signed)
[facility] [address]  [address] [place], [place], [address] Phone: [phone]   Fax:  [phone]  Physical Therapy Evaluation  Patient Details  Name: Natalie Daniel MRN: [id number] Date of Birth: 06-04-1970 Referring Provider: Rodolph Bong, MD  Encounter Date: 05/03/2015      PT End of Session - 05/03/15 0851    Visit Number 1   Number of Visits 16   Date for PT Re-Evaluation 06/28/15   PT Start Time 0800   PT Stop Time 0840   PT Time Calculation (min) 40 min   Activity Tolerance Patient tolerated treatment well   Behavior During Therapy [facility] for tasks assessed/performed      Past Medical History  Diagnosis Date  . Asthma   . Allergy     No past surgical history on file.  There were no vitals filed for this visit.  Visit Diagnosis:  Bilateral knee pain - Plan: PT plan of care cert/re-cert  Weakness of both lower extremities - Plan: PT plan of care cert/re-cert  Abnormality of gait - Plan: PT plan of care cert/re-cert  Difficulty walking - Plan: PT plan of care cert/re-cert      Subjective Assessment - 05/03/15 0800    Subjective Pt is a 44 y/o female who presents to OPPT for bil knee OA causing pain and difficulty with ADLs x 1 year.  Pt reports she's having increased difficulty with mobility and playing with grandchildren.    Pertinent History sciatica, L1 disc protrusion   Limitations Standing;Walking;House hold activities   How long can you stand comfortably? 1 hour   How long can you walk comfortably? 20-30 min   Diagnostic tests xrays severe OA   Patient Stated Goals improve pain, be able to play with grandchildren, walk without pain   Currently in Pain? Yes   Pain Score 3   worst: 8/10   Pain Location Knee   Pain Orientation Left;Right  L>R   Pain Descriptors / Indicators Throbbing;Aching;Shooting   Pain Type Chronic pain   Pain Onset More than a month ago   Pain Frequency Constant   Aggravating Factors  prolonged static positions, standing, walking, stairs   Pain Relieving Factors medication (meloxicam, chondroitin/glucasomine), repositioning and activity            [facility] PT Assessment - 05/03/15 0807    Assessment   Medical Diagnosis bil knee OA   Referring Provider Rodolph Bong, MD   Onset Date/Surgical Date --  1 year   Next MD Visit 6 weeks   Prior Therapy n/a   Precautions   Precautions None   Restrictions   Weight Bearing Restrictions No   Balance Screen   Has the patient fallen in the past 6 months No   Has the patient had a decrease in activity level because of a fear of falling?  No   Is the patient reluctant to leave their home because of a fear of falling?  No   Home Environment   Living Environment Private residence   Living Arrangements Spouse/significant other;Children  65 y/o daughter   Type of Home House   Additional Comments split level home with bedroom upstairs and laundry downstairs; kitchen and living room on main level   Prior Function   Level of Independence Independent   Vocation Full time employment   Therapist, art; mostly sitting at desk   Leisure playing with grandchildren, movies, shopping   Cognition   Overall  Cognitive Status Within Functional Limits for tasks assessed   Observation/Other Assessments   Focus on Therapeutic Outcomes (FOTO)  34 (66% limited; predicted 48% limited)   Observation/Other Assessments-Edema    Edema --  L knee lateral swelling noted   AROM   AROM Assessment Site Knee   Right/Left Knee Right;Left   Right Knee Extension 0   Right Knee Flexion 92   Left Knee Extension 0   Left Knee Flexion 94   Strength   Strength Assessment Site Hip;Knee   Right Hip Flexion 4/5   Right Hip Extension 3-/5   Right Hip ABduction 3+/5   Right Hip ADduction 4/5   Left Hip Flexion 3+/5   Left Hip Extension 3-/5   Left Hip ABduction 3/5   Left Hip ADduction 3+/5    Right/Left Knee Right;Left   Right Knee Flexion 4/5   Right Knee Extension 4/5   Left Knee Flexion 3/5   Left Knee Extension 3+/5   Palpation   Patella mobility hypomobility bil   Palpation comment no tenderness noted   Ambulation/Gait   Gait Pattern Decreased hip/knee flexion - right;Decreased hip/knee flexion - left;Right circumduction;Left circumduction                   [facility] Adult PT Treatment/Exercise - 05/03/15 0807    Self-Care   Self-Care Other Self-Care Comments   Other Self-Care Comments  clinical findings, POC and goals of care; community fitness options including water aerobics and recumbent bike; HEP                PT Education - 05/03/15 0851    Education provided Yes   Education Details see self care section   Person(s) Educated Patient   Methods Explanation   Comprehension Verbalized understanding          PT Short Term Goals - 05/03/15 0855    PT SHORT TERM GOAL #1   Title independent with HEP (05/31/15)   Time 4   Period Weeks   Status New   PT SHORT TERM GOAL #2   Title report pain < 6/10 with activity (05/31/15)   Time 4   Period Weeks   Status New           PT Long Term Goals - 05/03/15 0856    PT LONG TERM GOAL #1   Title report compliance with community fitness going to gym at least 2-3 times/week (06/28/15)   Time 8   Period Weeks   Status New   PT LONG TERM GOAL #2   Title report ability to negotiate stairs with pain < 5/10 for improved mobility (06/28/15)   Time 8   Period Weeks   Status New   PT LONG TERM GOAL #3   Title demonstrate at least 4/5 strength in bil lower extremities for improved function (06/28/15)   Time 8   Period Weeks   Status New   PT LONG TERM GOAL #4   Title bil knee AROM 0-100 for improved mobility and function (06/28/15)   Time 8   Period Weeks   Status New               Plan - 05/03/15 0851    Clinical Impression Statement Pt is a 44 y/o female who presents to OPPT with L>R bil  knee OA affecting functional mobility and ADLs.  Pt demonstrates decreased ROM and strength in bil lower extremities and will benefit from PT to maximize function and address deficits  listed above.    Pt will benefit from skilled therapeutic intervention in order to improve on the following deficits Abnormal gait;Increased edema;Pain;Decreased activity tolerance;Decreased endurance;Decreased range of motion;Decreased strength;Decreased mobility;Difficulty walking   Rehab Potential Good   PT Frequency 2x / week   PT Duration 8 weeks  anticipate d/c in 6 weeks   PT Treatment/Interventions ADLs/Self Care Home Management;Cryotherapy;Electrical Stimulation;Moist Heat;Therapeutic exercise;Therapeutic activities;Functional mobility training;Gait training;Stair training;Ultrasound;Balance training;Neuromuscular re-education;Patient/family education;Manual techniques;Vasopneumatic Device;Taping;Passive range of motion   PT Next Visit Plan review HEP, recumbent bike, low impact exercises and gradually progress to CKC as tolerated.   Consulted and Agree with Plan of Care Patient         Problem List Patient Active Problem List   Diagnosis Date Noted  . Late menses 12/20/2014  . Wellness examination 03/17/2014   Clarita CraneStephanie F Alejandro Adcox, PT, DPT 05/03/2015 9:05 AM  [facility] [place] [address]  [address] [place], [place], [address] Phone: [phone]   Fax:  [phone]  Name: Natalie Daniel MRN: [id number] Date of Birth: 05/31/1970

## 2015-05-03 NOTE — Patient Instructions (Signed)
KNEE: Extension, Long Arc Quads - Sitting    Raise leg until knee is straight. Repeat with other leg. _10-15__ reps per set, __2-3_ sets per day.  Copyright  VHI. All rights reserved.   Straight Leg Raise    Bend one leg. Raise other leg __6-8__ inches with knee locked. Exhale and tighten thigh muscles while raising leg. Repeat with other leg. Repeat __10-15__ times. Do _2-3___ sessions per day.  http://gt2.exer.us/270   Copyright  VHI. All rights reserved.   Bridging    Slowly raise buttocks from floor, keeping stomach tight. Repeat __10-15__ times per set. Do _1___ sets per session. Do __2-3__ sessions per day.  http://orth.exer.us/1097   Copyright  VHI. All rights reserved.   HIP / KNEE: Extension - Prone    Squeeze glutes. Raise leg up. Keep knee straight. _10-15__ reps per set, __2-3_ sets per day.  Copyright  VHI. All rights reserved.    HIP: Abduction - Side-Lying    Lie on side, legs straight and in line with trunk. Squeeze glutes. Raise top leg up and slightly back. Point toes forward. _10-15__ reps per set, _2-3__ sets per day.   Copyright  VHI. All rights reserved.

## 2015-05-08 ENCOUNTER — Ambulatory Visit (HOSPITAL_BASED_OUTPATIENT_CLINIC_OR_DEPARTMENT_OTHER)
Admission: RE | Admit: 2015-05-08 | Discharge: 2015-05-08 | Disposition: A | Discharge status: Home / Self Care | Payer: BLUE CROSS/BLUE SHIELD | Source: Ambulatory Visit | Attending: Medical | Admitting: Medical

## 2015-05-08 ENCOUNTER — Ambulatory Visit: Payer: BLUE CROSS/BLUE SHIELD | Admitting: Physical Therapy

## 2015-05-08 DIAGNOSIS — M25561 Pain in right knee: Secondary | ICD-10-CM | POA: Diagnosis not present

## 2015-05-08 DIAGNOSIS — M25562 Pain in left knee: Principal | ICD-10-CM

## 2015-05-08 DIAGNOSIS — R262 Difficulty in walking, not elsewhere classified: Secondary | ICD-10-CM

## 2015-05-08 DIAGNOSIS — Z1231 Encounter for screening mammogram for malignant neoplasm of breast: Secondary | ICD-10-CM | POA: Diagnosis present

## 2015-05-08 DIAGNOSIS — R269 Unspecified abnormalities of gait and mobility: Secondary | ICD-10-CM

## 2015-05-08 DIAGNOSIS — R29898 Other symptoms and signs involving the musculoskeletal system: Secondary | ICD-10-CM

## 2015-05-08 NOTE — Therapy (Signed)
[facility] [address]  [address] [place], [place], [address] Phone: [phone]   Fax:  [phone]  Physical Therapy Treatment  Patient Details  Name: Natalie Daniel MRN: [id number] Date of Birth: Dec 08, 1970 Referring Provider: Rodolph Bong, MD  Encounter Date: 05/08/2015      PT End of Session - 05/08/15 0757    Visit Number 2   Number of Visits 16   Date for PT Re-Evaluation 06/28/15   PT Start Time 0716   PT Stop Time 0757   PT Time Calculation (min) 41 min   Activity Tolerance Patient tolerated treatment well   Behavior During Therapy [facility] for tasks assessed/performed      Past Medical History  Diagnosis Date  . Asthma   . Allergy     No past surgical history on file.  There were no vitals filed for this visit.  Visit Diagnosis:  Bilateral knee pain  Weakness of both lower extremities  Abnormality of gait  Difficulty walking      Subjective Assessment - 05/08/15 0718    Subjective Feels okay.  No pain currently.   Patient Stated Goals improve pain, be able to play with grandchildren, walk without pain   Currently in Pain? No/denies                         [facility] Adult PT Treatment/Exercise - 05/08/15 0720    Exercises   Exercises Knee/Hip   Knee/Hip Exercises: Aerobic   Stationary Bike L3x8 min   Knee/Hip Exercises: Machines for Strengthening   Cybex Knee Extension 20# 2x10   Cybex Knee Flexion 35# 2x10   Knee/Hip Exercises: Standing   Hip Abduction Both;10 reps;Knee straight   Abduction Limitations 2#   Hip Extension Both;10 reps;Knee straight   Extension Limitations 2#   Knee/Hip Exercises: Seated   Long Arc Quad Both;10 reps   Knee/Hip Exercises: Supine   Bridges Both;10 reps   Straight Leg Raises Both;10 reps   Knee/Hip Exercises: Sidelying   Hip ABduction Both;10 reps   Knee/Hip Exercises: Prone   Hip Extension Both;10 reps                  PT Short  Term Goals - 05/08/15 0758    PT SHORT TERM GOAL #1   Title independent with HEP (05/31/15)   Status Achieved   PT SHORT TERM GOAL #2   Title report pain < 6/10 with activity (05/31/15)   Status On-going           PT Long Term Goals - 05/03/15 0856    PT LONG TERM GOAL #1   Title report compliance with community fitness going to gym at least 2-3 times/week (06/28/15)   Time 8   Period Weeks   Status New   PT LONG TERM GOAL #2   Title report ability to negotiate stairs with pain < 5/10 for improved mobility (06/28/15)   Time 8   Period Weeks   Status New   PT LONG TERM GOAL #3   Title demonstrate at least 4/5 strength in bil lower extremities for improved function (06/28/15)   Time 8   Period Weeks   Status New   PT LONG TERM GOAL #4   Title bil knee AROM 0-100 for improved mobility and function (06/28/15)   Time 8   Period Weeks   Status New  Plan - 05/08/15 0757    Clinical Impression Statement Pt independent with HEP and tolerated exercises well today without increase in pain.  Will continue to benefit from PT to maximize function.    Consulted and Agree with Plan of Care Patient        Problem List Patient Active Problem List   Diagnosis Date Noted  . Late menses 12/20/2014  . Wellness examination 03/17/2014   Clarita CraneStephanie F Trino Higinbotham, PT, DPT 05/08/2015 7:59 AM  [facility] [place] [address]  [address] [place], [place], [address] Phone: [phone]   Fax:  [phone]  Name: Natalie Daniel MRN: [id number] Date of Birth: 03/20/1971

## 2015-05-10 ENCOUNTER — Ambulatory Visit: Payer: BLUE CROSS/BLUE SHIELD

## 2015-05-15 ENCOUNTER — Ambulatory Visit: Payer: BLUE CROSS/BLUE SHIELD | Admitting: Physical Therapy

## 2015-05-15 DIAGNOSIS — M25561 Pain in right knee: Secondary | ICD-10-CM | POA: Diagnosis not present

## 2015-05-15 DIAGNOSIS — M25562 Pain in left knee: Principal | ICD-10-CM

## 2015-05-15 DIAGNOSIS — R29898 Other symptoms and signs involving the musculoskeletal system: Secondary | ICD-10-CM

## 2015-05-15 DIAGNOSIS — R262 Difficulty in walking, not elsewhere classified: Secondary | ICD-10-CM

## 2015-05-15 DIAGNOSIS — R269 Unspecified abnormalities of gait and mobility: Secondary | ICD-10-CM

## 2015-05-15 NOTE — Therapy (Signed)
[facility] [address]  [address] [place], [place], [address] Phone: [phone]   Fax:  [phone]  Physical Therapy Treatment  Patient Details  Name: Natalie Daniel MRN: [id number] Date of Birth: Mar 12, 1971 Referring Provider: Rodolph Bong, MD  Encounter Date: 05/15/2015      PT End of Session - 05/15/15 0749    Visit Number 3   Number of Visits 16   Date for PT Re-Evaluation 06/28/15   PT Start Time 0711   PT Stop Time 0750   PT Time Calculation (min) 39 min   Activity Tolerance Patient tolerated treatment well   Behavior During Therapy [facility] for tasks assessed/performed      Past Medical History  Diagnosis Date  . Asthma   . Allergy     No past surgical history on file.  There were no vitals filed for this visit.  Visit Diagnosis:  Bilateral knee pain  Weakness of both lower extremities  Abnormality of gait  Difficulty walking      Subjective Assessment - 05/15/15 0714    Subjective Had to cancel last visit due to pain in tooth; knees feel fine.  No pain in knees, just stiffness   Pertinent History sciatica, L1 disc protrusion   Limitations Standing;Walking;House hold activities   Patient Stated Goals improve pain, be able to play with grandchildren, walk without pain   Currently in Pain? No/denies                         [facility] Adult PT Treatment/Exercise - 05/15/15 0716    Knee/Hip Exercises: Stretches   Passive Hamstring Stretch Both;3 reps;30 seconds   Passive Hamstring Stretch Limitations supine with strap   Knee/Hip Exercises: Aerobic   Stationary Bike L3x8 min   Knee/Hip Exercises: Machines for Strengthening   Cybex Knee Extension 25# 2x15   Cybex Knee Flexion 35# 2x15   Knee/Hip Exercises: Standing   Other Standing Knee Exercises standing hip 4-way with green theraband x 10 bil   Knee/Hip Exercises: Supine   Bridges Both;10 reps   Bridges with Clamshell Both;10 reps                   PT Short Term Goals - 05/08/15 0758    PT SHORT TERM GOAL #1   Title independent with HEP (05/31/15)   Status Achieved   PT SHORT TERM GOAL #2   Title report pain < 6/10 with activity (05/31/15)   Status On-going           PT Long Term Goals - 05/03/15 0856    PT LONG TERM GOAL #1   Title report compliance with community fitness going to gym at least 2-3 times/week (06/28/15)   Time 8   Period Weeks   Status New   PT LONG TERM GOAL #2   Title report ability to negotiate stairs with pain < 5/10 for improved mobility (06/28/15)   Time 8   Period Weeks   Status New   PT LONG TERM GOAL #3   Title demonstrate at least 4/5 strength in bil lower extremities for improved function (06/28/15)   Time 8   Period Weeks   Status New   PT LONG TERM GOAL #4   Title bil knee AROM 0-100 for improved mobility and function (06/28/15)   Time 8   Period Weeks   Status New  Plan - 05/15/15 0750    Clinical Impression Statement Pt tolerated exercises well today, but reports limited intake x 4 days due to pain in tooth with eating and therfore increased fatigue today.  Pt will continue to benefit from PT to maximize function.   PT Next Visit Plan review HEP, recumbent bike, low impact exercises and gradually progress to CKC as tolerated.   Consulted and Agree with Plan of Care Patient        Problem List Patient Active Problem List   Diagnosis Date Noted  . Late menses 12/20/2014  . Wellness examination 03/17/2014   Clarita CraneStephanie F Quanisha Drewry, PT, DPT 05/15/2015 7:51 AM  [facility] [address]  [address] [place], [place], [address] Phone: [phone]   Fax:  [phone]  Name: Natalie Daniel MRN: [id number] Date of Birth: 05/21/1970

## 2015-05-16 ENCOUNTER — Other Ambulatory Visit: Payer: Self-pay

## 2015-05-16 NOTE — Telephone Encounter (Signed)
Please advise on refill.  Last refill was 12/20/14.

## 2015-05-17 ENCOUNTER — Ambulatory Visit: Payer: BLUE CROSS/BLUE SHIELD | Admitting: Physical Therapy

## 2015-05-17 ENCOUNTER — Telehealth: Payer: Self-pay | Admitting: Medical

## 2015-05-17 DIAGNOSIS — M25561 Pain in right knee: Secondary | ICD-10-CM

## 2015-05-17 DIAGNOSIS — R262 Difficulty in walking, not elsewhere classified: Secondary | ICD-10-CM

## 2015-05-17 DIAGNOSIS — M25562 Pain in left knee: Principal | ICD-10-CM

## 2015-05-17 DIAGNOSIS — R29898 Other symptoms and signs involving the musculoskeletal system: Secondary | ICD-10-CM

## 2015-05-17 DIAGNOSIS — R269 Unspecified abnormalities of gait and mobility: Secondary | ICD-10-CM

## 2015-05-17 MED ORDER — ALBUTEROL SULFATE HFA 108 (90 BASE) MCG/ACT IN AERS: 2.0000 | INHALATION_SPRAY | Freq: Four times a day (QID) | RESPIRATORY_TRACT | Status: DC | PRN

## 2015-05-17 NOTE — Therapy (Signed)
[facility] [address]  [address] [place], [place], [address] Phone: [phone]   Fax:  [phone]  Physical Therapy Evaluation  Patient Details  Name: Natalie Daniel MRN: [id number] Date of Birth: 06/16/1970 Referring Provider: Rodolph BongAdam Kendall, MD  Encounter Date: 05/17/2015      PT End of Session - 05/17/15 0754    Visit Number 4   Number of Visits 16   Date for PT Re-Evaluation 06/28/15   PT Start Time 0715   PT Stop Time 0800   PT Time Calculation (min) 45 min   Activity Tolerance Patient tolerated treatment well   Behavior During Therapy [facility] for tasks assessed/performed      Past Medical History  Diagnosis Date  . Asthma   . Allergy     No past surgical history on file.  There were no vitals filed for this visit.  Visit Diagnosis:  Bilateral knee pain - Plan: PT plan of care cert/re-cert  Weakness of both lower extremities - Plan: PT plan of care cert/re-cert  Abnormality of gait - Plan: PT plan of care cert/re-cert  Difficulty walking - Plan: PT plan of care cert/re-cert      Subjective Assessment - 05/17/15 0718    Subjective Having a lot of pain in hamstring on L knee; feels like she may have pulled it.   Currently in Pain? Yes   Pain Score 7    Pain Location Knee   Pain Orientation Left;Posterior   Pain Descriptors / Indicators Other (Comment)  pulling   Pain Type Acute pain   Pain Onset More than a month ago   Pain Frequency Intermittent   Aggravating Factors  stretching; knee extension   Pain Relieving Factors avoiding positions                       [facility] Adult PT Treatment/Exercise - 05/17/15 0719    Knee/Hip Exercises: Stretches   Passive Hamstring Stretch Both;3 reps;30 seconds   Passive Hamstring Stretch Limitations supine with strap; with knee ext and knee flexion   Other Knee/Hip Stretches ITB stretch 3 x 30 sec   Knee/Hip Exercises: Aerobic   Stationary Bike L3  x 5 min   Knee/Hip Exercises: Machines for Strengthening   Cybex Knee Extension 25# 2x15   Knee/Hip Exercises: Standing   Functional Squat 10 reps;5 seconds   Functional Squat Limitations TRX   Knee/Hip Exercises: Supine   Straight Leg Raises Both;10 reps   Straight Leg Raises Limitations 4#   Knee/Hip Exercises: Sidelying   Hip ABduction Both;10 reps   Hip ABduction Limitations 4#                  PT Short Term Goals - 05/08/15 0758    PT SHORT TERM GOAL #1   Title independent with HEP (05/31/15)   Status Achieved   PT SHORT TERM GOAL #2   Title report pain < 6/10 with activity (05/31/15)   Status On-going           PT Long Term Goals - 05/03/15 0856    PT LONG TERM GOAL #1   Title report compliance with community fitness going to gym at least 2-3 times/week (06/28/15)   Time 8   Period Weeks   Status New   PT LONG TERM GOAL #2   Title report ability to negotiate stairs with pain < 5/10 for improved mobility (06/28/15)   Time 8   Period  Weeks   Status New   PT LONG TERM GOAL #3   Title demonstrate at least 4/5 strength in bil lower extremities for improved function (06/28/15)   Time 8   Period Weeks   Status New   PT LONG TERM GOAL #4   Title bil knee AROM 0-100 for improved mobility and function (06/28/15)   Time 8   Period Weeks   Status New               Plan - 05/17/15 0755    Clinical Impression Statement Pt c/o increased pain in L hamstring today.  Limited exercises today activating hamstrings to help with pain, performed stretches to help.  Pt will continue to benefit from PT to maximize function.   PT Next Visit Plan review HEP, recumbent bike, low impact exercises and gradually progress to CKC as tolerated.         Problem List Patient Active Problem List   Diagnosis Date Noted  . Late menses 12/20/2014  . Wellness examination 03/17/2014   Clarita Crane, PT, DPT 05/17/2015 8:00 AM  [facility] [address]  [address] [place], [place], [address] Phone: [phone]   Fax:  [phone]  Name: Natalie Daniel MRN: [id number] Date of Birth: 04-20-1971

## 2015-05-17 NOTE — Telephone Encounter (Signed)
Which meds does she need refills? I have not seen her since August. So could write brief refill of med depending on what she wants. But would want to see her within a month. Would you find out what she wants and load those for refill.

## 2015-05-22 ENCOUNTER — Ambulatory Visit: Payer: BLUE CROSS/BLUE SHIELD

## 2015-05-24 ENCOUNTER — Ambulatory Visit: Payer: BLUE CROSS/BLUE SHIELD

## 2015-05-29 ENCOUNTER — Ambulatory Visit: Payer: BLUE CROSS/BLUE SHIELD | Attending: Sports Medicine | Admitting: Physical Therapy

## 2015-05-29 DIAGNOSIS — R269 Unspecified abnormalities of gait and mobility: Secondary | ICD-10-CM | POA: Diagnosis present

## 2015-05-29 DIAGNOSIS — M25561 Pain in right knee: Secondary | ICD-10-CM | POA: Insufficient documentation

## 2015-05-29 DIAGNOSIS — M25562 Pain in left knee: Secondary | ICD-10-CM | POA: Diagnosis present

## 2015-05-29 DIAGNOSIS — R29898 Other symptoms and signs involving the musculoskeletal system: Secondary | ICD-10-CM | POA: Diagnosis present

## 2015-05-29 DIAGNOSIS — R262 Difficulty in walking, not elsewhere classified: Secondary | ICD-10-CM | POA: Diagnosis present

## 2015-05-29 NOTE — Therapy (Signed)
[facility] [address]  [address] [place], [place], [address] Phone: [phone]   Fax:  [phone]  Physical Therapy Treatment  Patient Details  Name: Natalie Daniel MRN: [id number] Date of Birth: 08/20/1970 Referring Provider: Rodolph BongAdam Kendall, MD  Encounter Date: 05/29/2015      PT End of Session - 05/29/15 0759    Visit Number 5   Number of Visits 16   Date for PT Re-Evaluation 06/28/15   PT Start Time 0730  pt arrived late   PT Stop Time 0759   PT Time Calculation (min) 29 min   Activity Tolerance Patient tolerated treatment well;No increased pain   Behavior During Therapy [facility] for tasks assessed/performed      Past Medical History  Diagnosis Date  . Asthma   . Allergy     No past surgical history on file.  There were no vitals filed for this visit.  Visit Diagnosis:  Bilateral knee pain  Weakness of both lower extremities  Abnormality of gait  Difficulty walking      Subjective Assessment - 05/29/15 0731    Subjective Knees have been aching some today. "I think it's the weather."   Pertinent History sciatica, L1 disc protrusion   Limitations Standing;Walking;House hold activities   Patient Stated Goals improve pain, be able to play with grandchildren, walk without pain   Currently in Pain? Yes   Pain Score 5    Pain Location Knee   Pain Orientation Right;Left   Pain Descriptors / Indicators Sore;Aching   Pain Type Acute pain   Pain Onset More than a month ago   Pain Frequency Intermittent   Aggravating Factors  cold, stretching, knee extension   Pain Relieving Factors avoiding positions that cause pain                         [facility] Adult PT Treatment/Exercise - 05/29/15 0733    Knee/Hip Exercises: Aerobic   Stationary Bike L3 x 5 min   Elliptical L2 x 5 min   Knee/Hip Exercises: Machines for Strengthening   Cybex Knee Extension 25# 2x15   Cybex Knee Flexion 35# 2x15   Cybex Leg Press 35# 2x15                  PT Short Term Goals - 05/29/15 0801    PT SHORT TERM GOAL #1   Title independent with HEP (05/31/15)   Status Achieved   PT SHORT TERM GOAL #2   Title report pain < 6/10 with activity (05/31/15)   Status Achieved           PT Long Term Goals - 05/03/15 0856    PT LONG TERM GOAL #1   Title report compliance with community fitness going to gym at least 2-3 times/week (06/28/15)   Time 8   Period Weeks   Status New   PT LONG TERM GOAL #2   Title report ability to negotiate stairs with pain < 5/10 for improved mobility (06/28/15)   Time 8   Period Weeks   Status New   PT LONG TERM GOAL #3   Title demonstrate at least 4/5 strength in bil lower extremities for improved function (06/28/15)   Time 8   Period Weeks   Status New   PT LONG TERM GOAL #4   Title bil knee AROM 0-100 for improved mobility and function (06/28/15)   Time 8   Period Weeks  Status New               Plan - 05/29/15 0759    Clinical Impression Statement Pt reports pain in L hamstring is gone, but overall increased soreness.  Pt attributes it likely due to weather.  Will continue to benefit from PT to maximize function.   PT Next Visit Plan bike/elliptical, CKC exercises as tolerated   Consulted and Agree with Plan of Care Patient        Problem List Patient Active Problem List   Diagnosis Date Noted  . Late menses 12/20/2014  . Wellness examination 03/17/2014   Natalie Daniel, PT, DPT 05/29/2015 8:02 AM  [facility] [address]  [address] [place], [place], [address] Phone: [phone]   Fax:  [phone]  Name: Natalie Daniel MRN: [id number] Date of Birth: [birth year]

## 2015-05-31 ENCOUNTER — Ambulatory Visit: Payer: BLUE CROSS/BLUE SHIELD

## 2015-06-05 ENCOUNTER — Ambulatory Visit: Payer: BLUE CROSS/BLUE SHIELD | Admitting: Physical Therapy

## 2015-06-05 DIAGNOSIS — R29898 Other symptoms and signs involving the musculoskeletal system: Secondary | ICD-10-CM

## 2015-06-05 DIAGNOSIS — M25562 Pain in left knee: Principal | ICD-10-CM

## 2015-06-05 DIAGNOSIS — R269 Unspecified abnormalities of gait and mobility: Secondary | ICD-10-CM

## 2015-06-05 DIAGNOSIS — M25561 Pain in right knee: Secondary | ICD-10-CM

## 2015-06-05 DIAGNOSIS — R262 Difficulty in walking, not elsewhere classified: Secondary | ICD-10-CM

## 2015-06-05 NOTE — Patient Instructions (Signed)
   Machines you can use at the gym:  Cardio Equipment 1. Recumbent Bike 2. Elliptical 3. Seated Stepper  Weight/Strengthening Machines 1. Knee Extension 2. Knee Flexion/Hamstring Curls 3. Leg Press 4. Hip Abduction 5. Hip Adduction 6. Hip Extension (may not have this machine)

## 2015-06-05 NOTE — Therapy (Signed)
[facility] [address]  [place], [place], [address] Phone: [phone]   Fax:  [phone]  Physical Therapy Treatment  Patient Details  Name: Natalie Daniel MRN: [id number] Date of Birth: [birth year] Referring Provider: Vickki Hearing, MD  Encounter Date: 06/05/2015      PT End of Session - 06/05/15 0748    Visit Number 6   Number of Visits 16   Date for PT Re-Evaluation 06/28/15   PT Start Time 3154   PT Stop Time 0749   PT Time Calculation (min) 32 min   Activity Tolerance Patient tolerated treatment well;No increased pain   Behavior During Therapy [facility] for tasks assessed/performed      Past Medical History  Diagnosis Date  . Asthma   . Allergy     No past surgical history on file.  There were no vitals filed for this visit.  Visit Diagnosis:  Bilateral knee pain  Weakness of both lower extremities  Abnormality of gait  Difficulty walking      Subjective Assessment - 06/05/15 0720    Subjective Knees are only hurting when staying in one position for too long; feel fine currently.   Patient Stated Goals improve pain, be able to play with grandchildren, walk without pain   Currently in Pain? No/denies            [facility] PT Assessment - 06/05/15 0732    AROM   Right Knee Extension 0   Right Knee Flexion 115   Left Knee Extension 0   Left Knee Flexion 113   Strength   Right Hip Flexion 5/5   Right Hip Extension 4/5   Right Hip ABduction 5/5   Right Hip ADduction 4/5   Left Hip Flexion 5/5   Left Hip Extension 4/5   Left Hip ABduction 4/5   Left Hip ADduction 5/5   Right Knee Flexion 4/5   Right Knee Extension 5/5   Left Knee Flexion 4/5   Left Knee Extension 5/5                     [facility] Adult PT Treatment/Exercise - 06/05/15 0721    Ambulation/Gait   Gait Comments negotiated flight of stairs reciprocally without rails; pain 0/10   Knee/Hip Exercises: Aerobic   Stationary Bike L3 x 5 min   Elliptical L2 x 5 min                PT Education - 06/05/15 0748    Education provided Yes   Education Details gym equipment   Person(s) Educated Patient   Methods Explanation;Handout   Comprehension Verbalized understanding          PT Short Term Goals - 05/29/15 0801    PT SHORT TERM GOAL #1   Title independent with HEP (05/31/15)   Status Achieved   PT SHORT TERM GOAL #2   Title report pain < 6/10 with activity (05/31/15)   Status Achieved           PT Long Term Goals - 06/05/15 0750    PT LONG TERM GOAL #1   Title report compliance with community fitness going to gym at least 2-3 times/week (06/28/15)   Baseline has joined and verbalized plans to go 3-4 times/week   Status Partially Met   PT LONG TERM GOAL #2   Title report ability to negotiate stairs with pain < 5/10 for improved mobility (06/28/15)   Status Achieved  PT LONG TERM GOAL #3   Title demonstrate at least 4/5 strength in bil lower extremities for improved function (06/28/15)   Status Achieved   PT LONG TERM GOAL #4   Title bil knee AROM 0-100 for improved mobility and function (06/28/15)   Status Achieved               Plan - 06/05/15 0749    Clinical Impression Statement Pt has met all goals and is ready for d/c.  Pt partially met LTG #1 as she just joined fitness center yesterday, but plans to go 3-4 times per week.   PT Next Visit Plan d/c PT today.   Consulted and Agree with Plan of Care Patient        Problem List Patient Active Problem List   Diagnosis Date Noted  . Late menses 12/20/2014  . Wellness examination 03/17/2014   Laureen Abrahams, PT, DPT 06/05/2015 7:51 AM  [facility] [address]  [place], [place], [address] Phone: [phone]   Fax:  [phone]  Name: Natalie Daniel MRN: [id number] Date of Birth: [birth year]    PHYSICAL THERAPY DISCHARGE SUMMARY  Visits  from Start of Care: 6  Current functional level related to goals / functional outcomes: See above; all goals met   Remaining deficits: Pain with prolonged static positioning; pt verbalized understanding of how to manage pain and maintain current function.   Education / Equipment: HEP; community fitness  Plan: Patient agrees to discharge.  Patient goals were met. Patient is being discharged due to meeting the stated rehab goals.  ?????    Laureen Abrahams, PT, DPT 06/05/2015 7:52 AM  [facility] [place], [place] [address]  [phone] (office) [phone] (fax)

## 2015-06-26 ENCOUNTER — Telehealth: Payer: Self-pay | Admitting: Medical

## 2015-06-26 NOTE — Telephone Encounter (Signed)
lvm inquiring if patient received flu shot  °

## 2015-07-30 ENCOUNTER — Telehealth: Payer: Self-pay | Admitting: Medical

## 2015-07-30 MED ORDER — ALBUTEROL SULFATE HFA 108 (90 BASE) MCG/ACT IN AERS: 2.0000 | INHALATION_SPRAY | Freq: Four times a day (QID) | RESPIRATORY_TRACT | Status: AC | PRN

## 2015-07-30 NOTE — Telephone Encounter (Signed)
Pharmacy: [facility] [place], [place] - [address]  Reason for call: Pt requesting refill on proair/albuterol inhaler. Hers is empty.

## 2015-07-30 NOTE — Telephone Encounter (Signed)
Refill of albuterol inhaler sent in.

## 2015-07-30 NOTE — Telephone Encounter (Signed)
Edward please advise.  

## 2015-08-01 NOTE — Telephone Encounter (Signed)
Ph has been informed.

## 2015-09-05 ENCOUNTER — Telehealth: Payer: Self-pay | Admitting: Medical

## 2015-09-17 NOTE — Telephone Encounter (Signed)
Please have the pt come in for an office visit if she is having any sinus symptoms or cold symptoms.

## 2016-08-02 ENCOUNTER — Other Ambulatory Visit (HOSPITAL_BASED_OUTPATIENT_CLINIC_OR_DEPARTMENT_OTHER): Payer: Self-pay | Admitting: *Deleted

## 2016-08-02 DIAGNOSIS — Z1231 Encounter for screening mammogram for malignant neoplasm of breast: Secondary | ICD-10-CM

## 2016-08-11 ENCOUNTER — Ambulatory Visit (HOSPITAL_BASED_OUTPATIENT_CLINIC_OR_DEPARTMENT_OTHER): Payer: BLUE CROSS/BLUE SHIELD

## 2016-09-09 ENCOUNTER — Ambulatory Visit (HOSPITAL_BASED_OUTPATIENT_CLINIC_OR_DEPARTMENT_OTHER): Payer: BLUE CROSS/BLUE SHIELD

## 2016-11-27 IMAGING — CR DG KNEE AP/LAT W/ SUNRISE*L*
1 series · 1 of 1 positions shown · non-contrast
Comparison: None.

CLINICAL DATA: Pain.  No reported injury.

EXAM:
LEFT KNEE 3 VIEWS

[view not recorded]
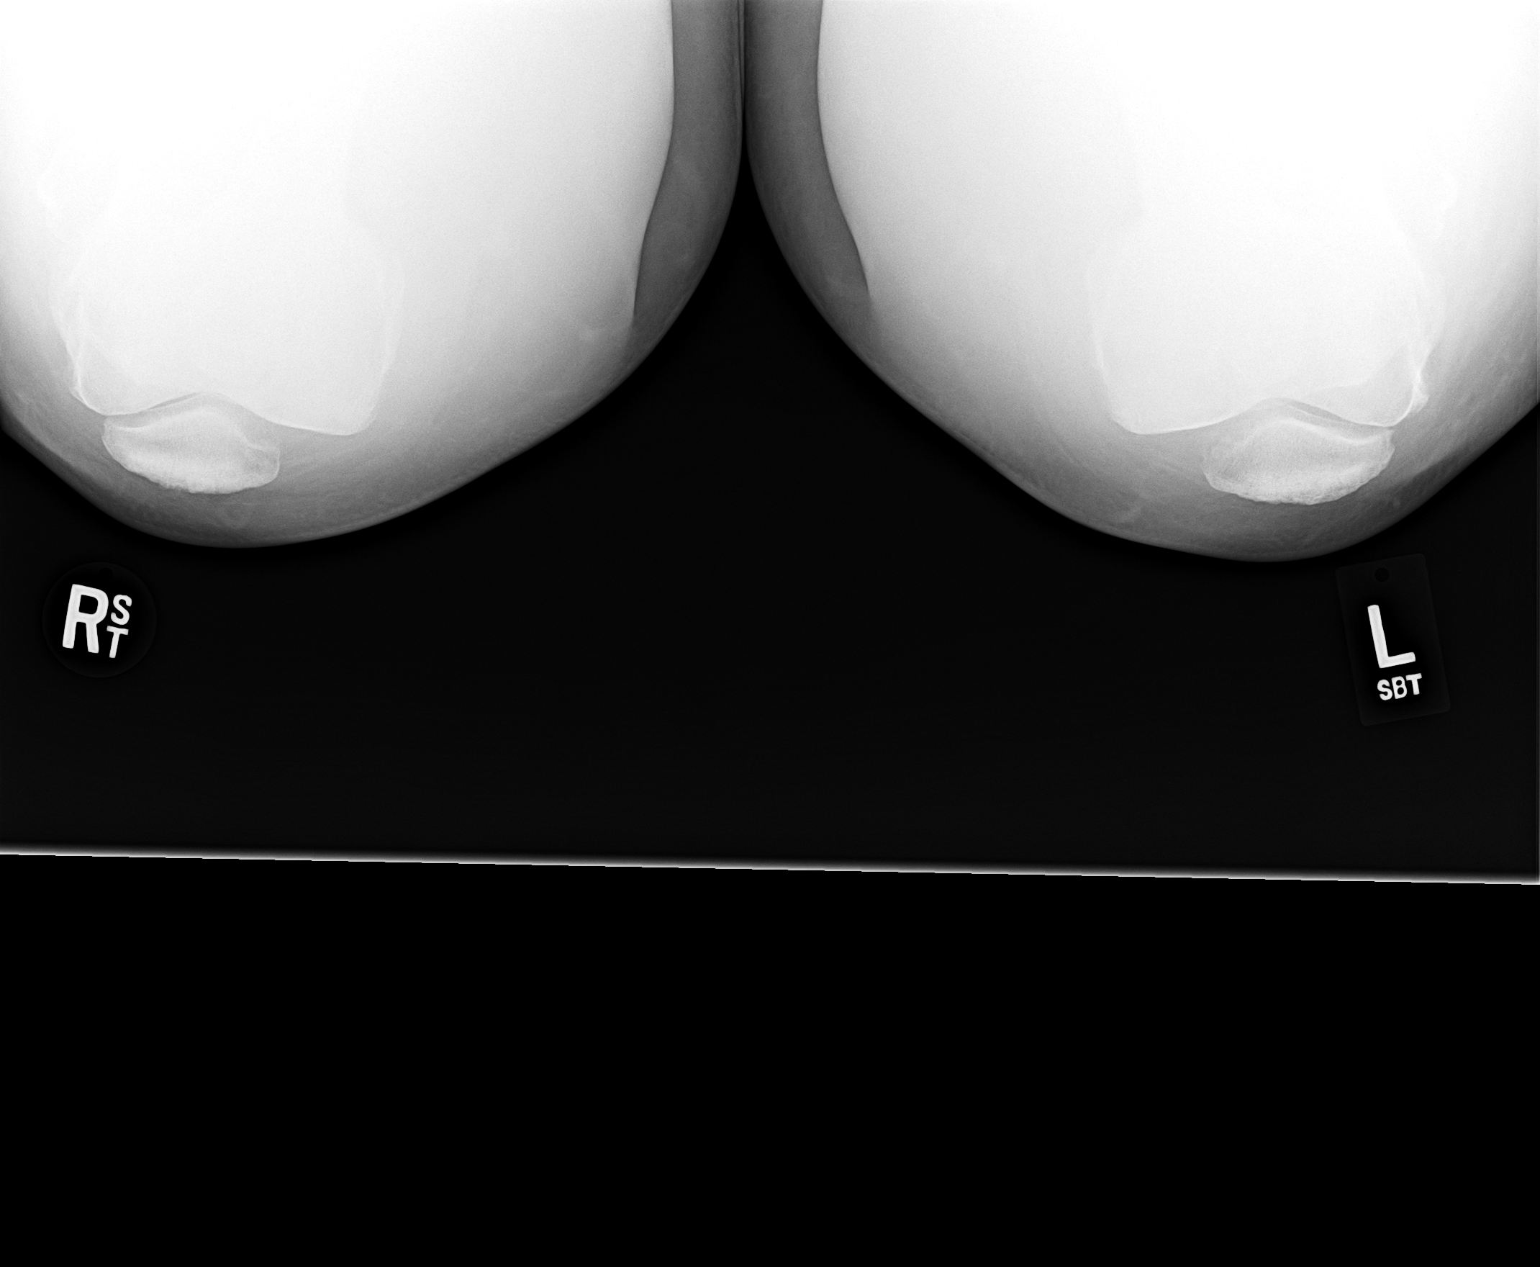

[1 of 1 positions shown; findings below may reference images not displayed]

FINDINGS: Tricompartment degenerative change present. Degenerative changes
most prominent about the medial compartment. No acute soft tissue or
bony abnormality.
IMPRESSION: Tricompartment degenerative change.  No acute abnormality.

## 2017-04-15 IMAGING — MG MM DIGITAL SCREENING BILAT W/ CAD
5 series · 5 of 5 positions shown · non-contrast
Comparison: None.

CLINICAL DATA: Screening.

EXAM:
DIGITAL SCREENING BILATERAL MAMMOGRAM WITH CAD

[R CC (1 of 2)]
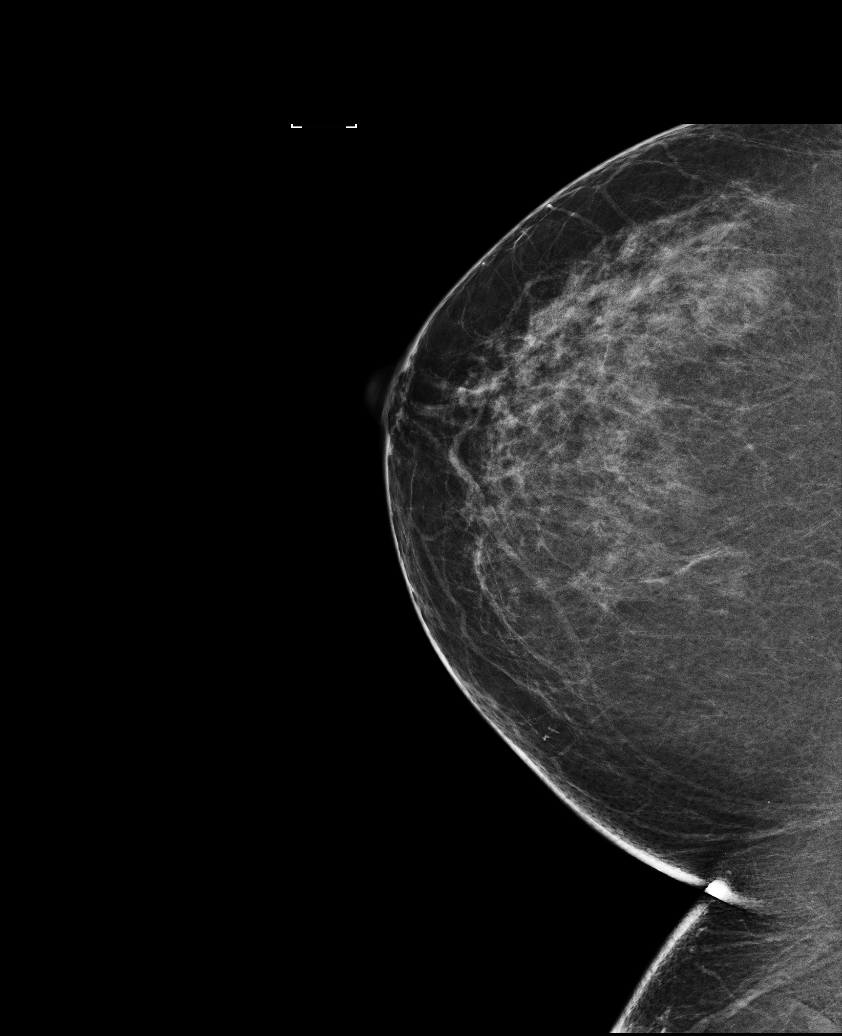

[R MLO]
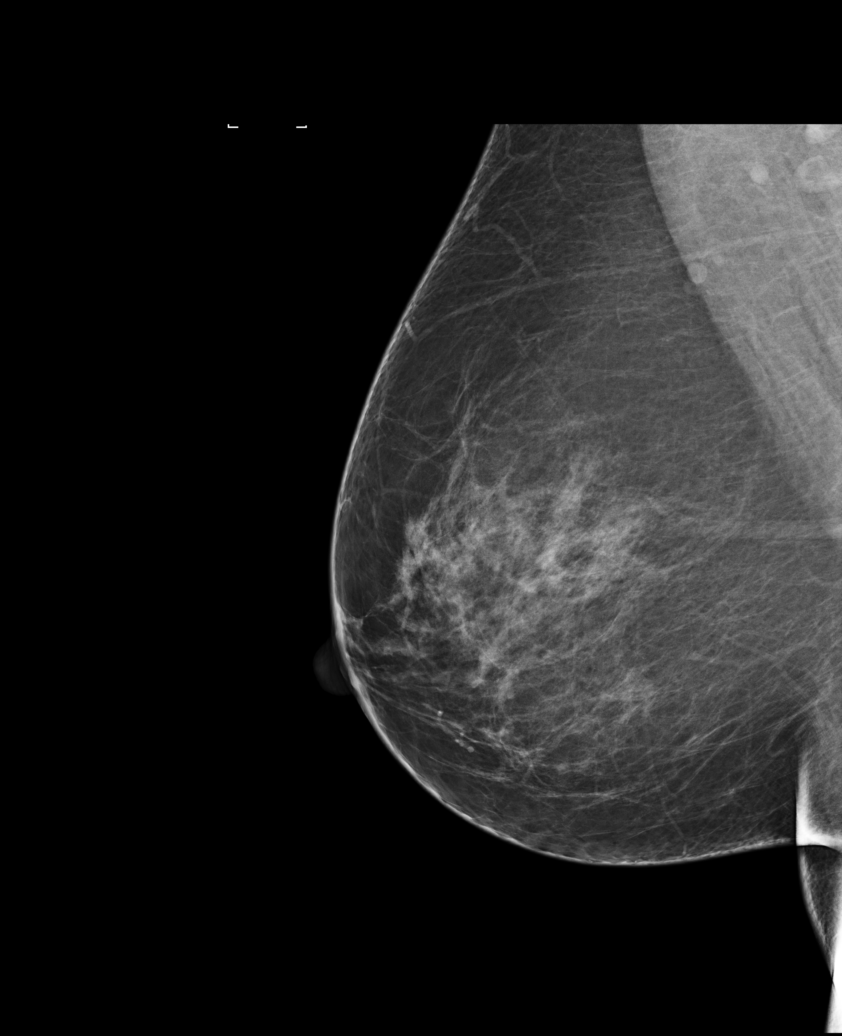

[L MLO]
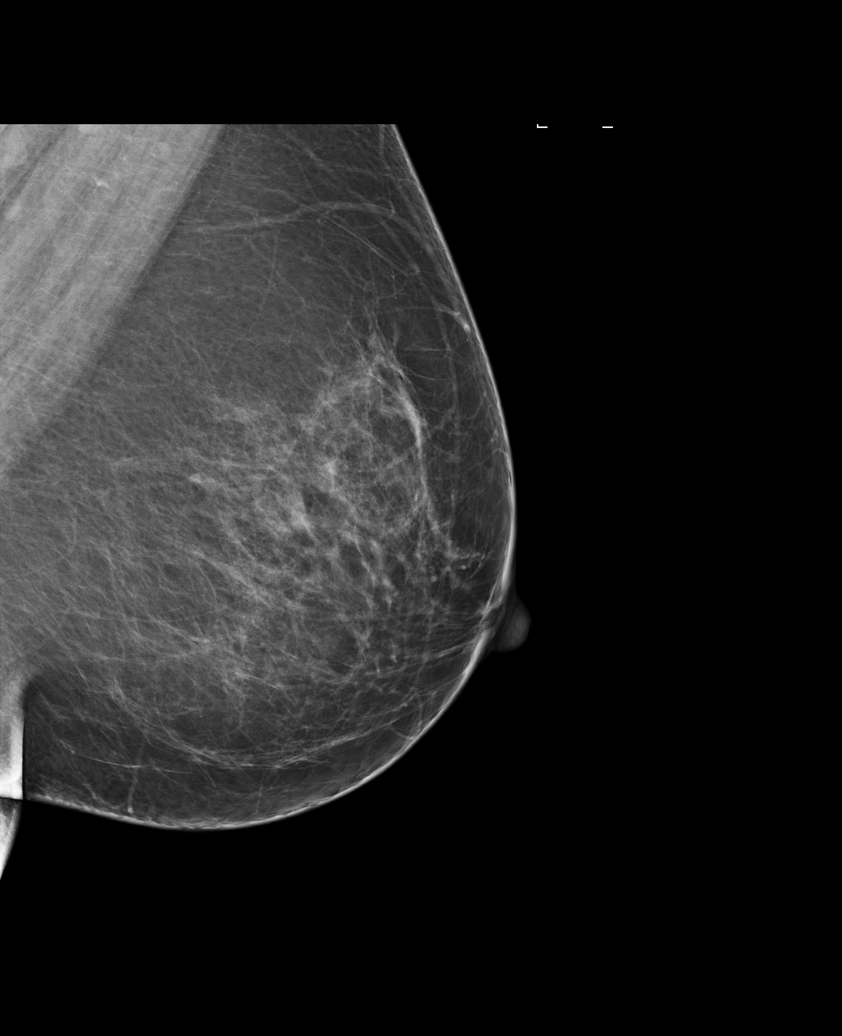

[R CC (2 of 2)]
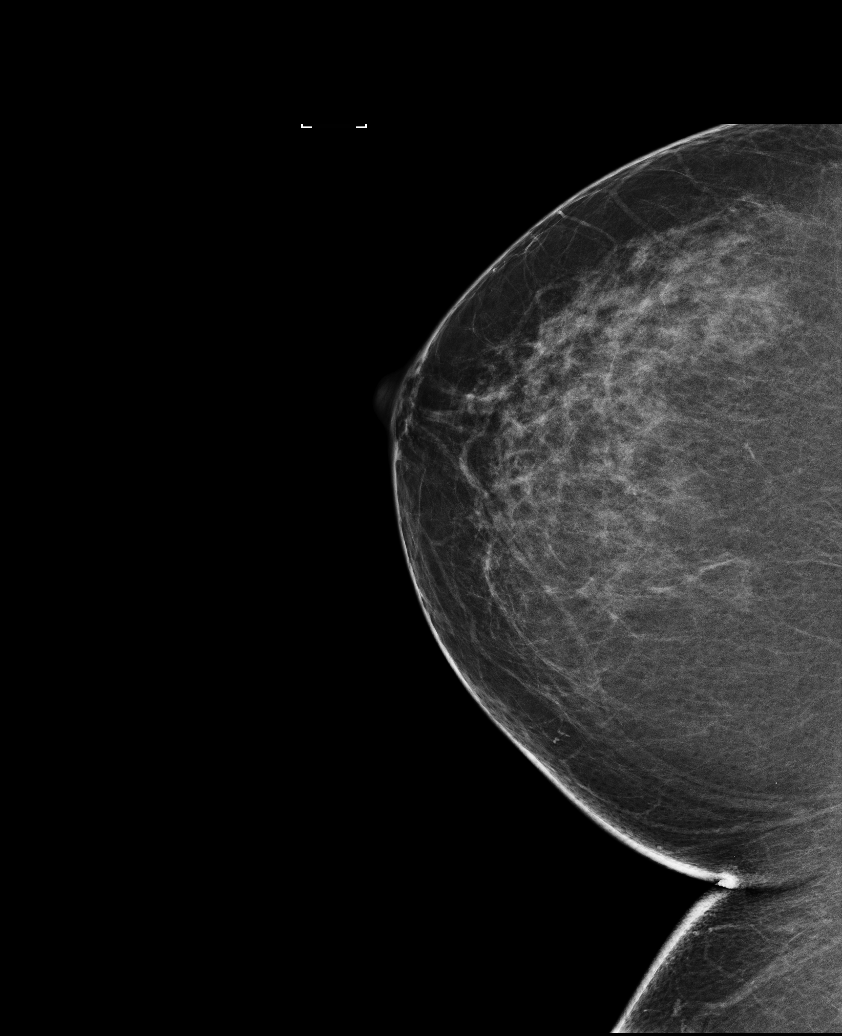

[L CC]
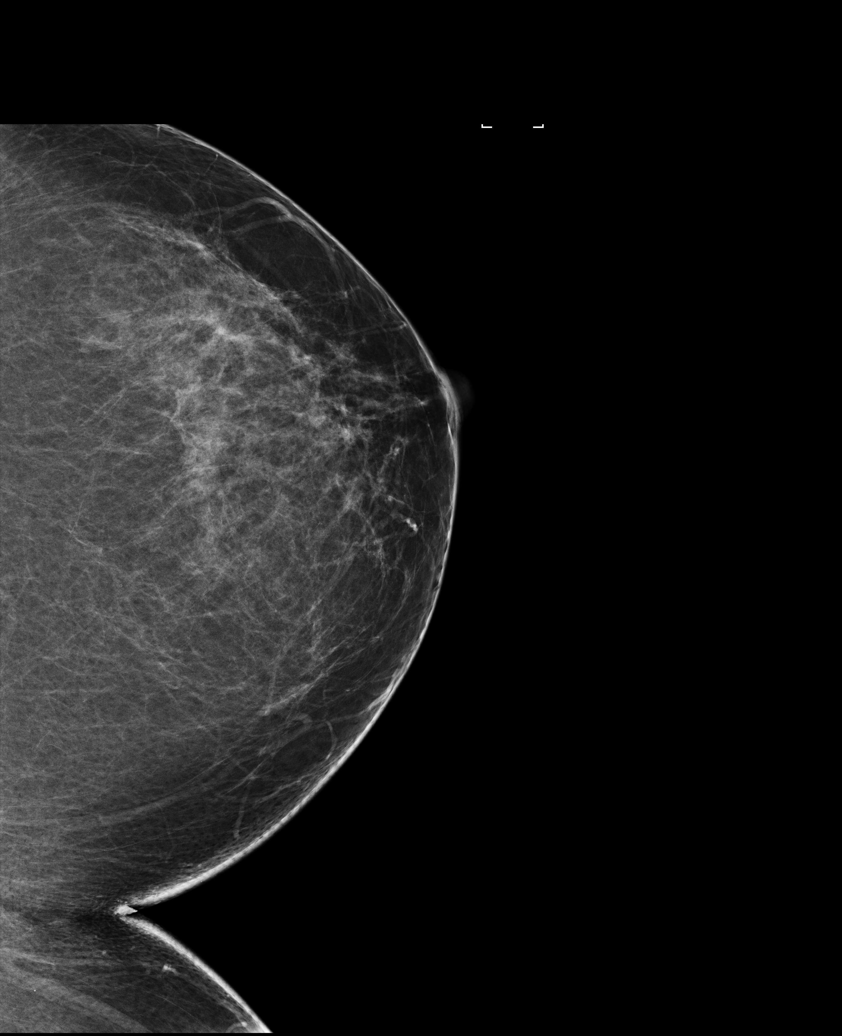

[5 of 5 positions shown; findings below may reference images not displayed]

ACR Breast Density Category b: There are scattered areas of
fibroglandular density.
FINDINGS: There are no findings suspicious for malignancy. Images were
processed with CAD.
IMPRESSION: No mammographic evidence of malignancy. A result letter of this
screening mammogram will be mailed directly to the patient.

RECOMMENDATION:
Screening mammogram in one year. (Code:[id number])

BI-RADS CATEGORY  1: Negative.

## 2019-10-01 ENCOUNTER — Ambulatory Visit: Payer: BLUE CROSS/BLUE SHIELD | Attending: Internal Medicine

## 2019-10-01 DIAGNOSIS — Z23 Encounter for immunization: Secondary | ICD-10-CM

## 2019-10-01 NOTE — Progress Notes (Signed)
   Covid-19 Vaccination Clinic  Name:  Natalie Daniel    MRN: [id number] DOB: 09/30/1970  10/01/2019  Natalie Daniel was observed post Covid-19 immunization for 15 minutes without incident. She was provided with Vaccine Information Sheet and instruction to access the V-Safe system.   Natalie Daniel was instructed to call 911 with any severe reactions post vaccine: Marland Kitchen Difficulty breathing  . Swelling of face and throat  . A fast heartbeat  . A bad rash all over body  . Dizziness and weakness   Immunizations Administered    Name Date Dose VIS Date Route   Pfizer COVID-19 Vaccine 10/01/2019 11:25 AM 0.3 mL 07/13/2018 Intramuscular   Manufacturer: [employer], [employer]   Lot: [id number]   NDC: [address]-7

## 2019-10-24 ENCOUNTER — Ambulatory Visit: Payer: BLUE CROSS/BLUE SHIELD | Attending: Internal Medicine

## 2019-10-24 DIAGNOSIS — Z23 Encounter for immunization: Secondary | ICD-10-CM

## 2019-10-24 NOTE — Progress Notes (Signed)
   Covid-19 Vaccination Clinic  Name:  Natalie Daniel    MRN: [id number] DOB: Sep 18, 1970  10/24/2019  [name] was observed post Covid-19 immunization for 15 minutes without incident. She was provided with Vaccine Information Sheet and instruction to access the V-Safe system.   [name] was instructed to call 911 with any severe reactions post vaccine: Marland Kitchen Difficulty breathing  . Swelling of face and throat  . A fast heartbeat  . A bad rash all over body  . Dizziness and weakness   Immunizations Administered    Name Date Dose VIS Date Route   Pfizer COVID-19 Vaccine 10/24/2019  3:53 PM 0.3 mL 07/13/2018 Intramuscular   Manufacturer: [employer], [employer]   Lot: [id number]   NDC: [address]-4

## 2024-05-20 ENCOUNTER — Emergency Department (HOSPITAL_BASED_OUTPATIENT_CLINIC_OR_DEPARTMENT_OTHER)
Admission: EM | Admit: 2024-05-20 | Discharge: 2024-05-20 | Disposition: A | Discharge status: Home / Self Care | Attending: Emergency Medicine | Admitting: Emergency Medicine

## 2024-05-20 ENCOUNTER — Other Ambulatory Visit: Payer: Self-pay

## 2024-05-20 ENCOUNTER — Encounter (HOSPITAL_BASED_OUTPATIENT_CLINIC_OR_DEPARTMENT_OTHER): Payer: Self-pay

## 2024-05-20 DIAGNOSIS — X58XXXA Exposure to other specified factors, initial encounter: Secondary | ICD-10-CM | POA: Insufficient documentation

## 2024-05-20 DIAGNOSIS — T192XXA Foreign body in vulva and vagina, initial encounter: Secondary | ICD-10-CM | POA: Insufficient documentation

## 2024-05-20 NOTE — ED Triage Notes (Signed)
 Pt arrives with c/o foreign body in her vagina. Pt reports she has a piece of a tightening wand in her vagina. Pt reports it has been in in her vagina for about 4 hours.

## 2024-05-20 NOTE — ED Provider Notes (Signed)
" °  [facility] Provider Note   CSN: [id number] Arrival date & time: 05/20/24  0209     Patient presents with: Foreign Body in Vagina   Natalie Daniel is a 54 y.o. female.   Patient is a 54 year old female presenting with complaints of vaginal foreign body.  She reports there is a piece of what she describes as a tightening wand in her vagina she has been unable to retrieve.       Prior to Admission medications  Medication Sig Start Date End Date Taking? Authorizing Provider  albuterol  (PROVENTIL  HFA;VENTOLIN  HFA) 108 (90 Base) MCG/ACT inhaler Inhale 2 puffs into the lungs every 6 (six) hours as needed for wheezing or shortness of breath. 07/30/15   Saguier, [place], PA-C  beclomethasone (QVAR ) 40 MCG/ACT inhaler Inhale 2 puffs into the lungs 2 (two) times daily. 12/20/14   Saguier, Dallas, PA-C  fluticasone  (FLOVENT  HFA) 110 MCG/ACT inhaler Inhale 2 puffs into the lungs 2 (two) times daily. 12/20/14   Saguier, Dallas, PA-C  montelukast  (SINGULAIR ) 10 MG tablet Take 1 tablet (10 mg total) by mouth at bedtime. 12/20/14   Saguier, Dallas, PA-C  predniSONE  (DELTASONE ) 20 MG tablet 2 tab po bid x day 1 and day 2 then 1 tab po tid day 3 and day 4, then 1 tab po bid day 5 and day 6 01/05/15   Saguier, Dallas, PA-C    Allergies: Vioxx [rofecoxib]    Review of Systems  All other systems reviewed and are negative.   Updated Vital Signs BP (!) 162/86   Pulse 78   Temp 97.8 F (36.6 C) (Oral)   Resp 19   Wt 90.7 kg   SpO2 100%   BMI 33.54 kg/m   Physical Exam Vitals and nursing note reviewed.  HENT:     Head: Normocephalic.  Pulmonary:     Effort: Pulmonary effort is normal.  Genitourinary:    Comments: The foreign body was identified and removed using ring forceps.  Remainder of the exam otherwise unremarkable. Skin:    General: Skin is warm and dry.  Neurological:     Mental Status: She is alert and oriented to person, place, and time.      (all labs ordered are listed, but only abnormal results are displayed) Labs Reviewed - No data to display  EKG: None  Radiology: No results found.   Procedures   Medications Ordered in the ED - No data to display                                  Medical Decision Making  Foreign body retrieved with ring forceps.  Patient to follow-up as needed.     Final diagnoses:  Foreign body in vagina, initial encounter    ED Discharge Orders     None          Geroldine Berg, MD 05/20/24 9690  "

## 2024-05-20 NOTE — Discharge Instructions (Signed)
 Return to the emergency department if you experience any new and/or concerning issues.
